# Patient Record
Sex: Male | Born: 1995 | Race: White | Hispanic: No | Marital: Single | Smoking: Current every day smoker
Health system: Southern US, Community
[De-identification: ages and names within clinical notes are randomized; demographics above are authoritative.]

## PROBLEM LIST (undated history)

## (undated) DIAGNOSIS — K589 Irritable bowel syndrome without diarrhea: Secondary | ICD-10-CM

## (undated) DIAGNOSIS — K219 Gastro-esophageal reflux disease without esophagitis: Secondary | ICD-10-CM

## (undated) DIAGNOSIS — F5089 Other specified eating disorder: Secondary | ICD-10-CM

## (undated) DIAGNOSIS — F5083 Pica in adults: Secondary | ICD-10-CM

## (undated) DIAGNOSIS — R109 Unspecified abdominal pain: Secondary | ICD-10-CM

## (undated) HISTORY — DX: Unspecified abdominal pain: R10.9

---
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
## 2015-07-16 ENCOUNTER — Encounter: Payer: Self-pay | Admitting: Emergency Medicine

## 2015-07-16 ENCOUNTER — Emergency Department
Admission: EM | Admit: 2015-07-16 | Discharge: 2015-07-16 | Disposition: A | Discharge status: Home / Self Care | Payer: Medicaid Other | Attending: Emergency Medicine | Admitting: Emergency Medicine

## 2015-07-16 ENCOUNTER — Emergency Department: Payer: Medicaid Other

## 2015-07-16 DIAGNOSIS — R109 Unspecified abdominal pain: Secondary | ICD-10-CM | POA: Insufficient documentation

## 2015-07-16 DIAGNOSIS — R112 Nausea with vomiting, unspecified: Secondary | ICD-10-CM

## 2015-07-16 DIAGNOSIS — Z720 Tobacco use: Secondary | ICD-10-CM | POA: Insufficient documentation

## 2015-07-16 LAB — URINALYSIS COMPLETE WITH MICROSCOPIC (ARMC ONLY)
BILIRUBIN URINE: NEGATIVE
Bacteria, UA: NONE SEEN
Glucose, UA: NEGATIVE mg/dL
Hgb urine dipstick: NEGATIVE
KETONES UR: NEGATIVE mg/dL
LEUKOCYTES UA: NEGATIVE
Nitrite: NEGATIVE
PH: 7 (ref 5.0–8.0)
Protein, ur: NEGATIVE mg/dL
RBC / HPF: NONE SEEN RBC/hpf (ref 0–5)
SQUAMOUS EPITHELIAL / LPF: NONE SEEN
Specific Gravity, Urine: 1.017 (ref 1.005–1.030)
WBC UA: NONE SEEN WBC/hpf (ref 0–5)

## 2015-07-16 LAB — CBC
HEMATOCRIT: 45.8 % (ref 40.0–52.0)
HEMOGLOBIN: 15.5 g/dL (ref 13.0–18.0)
MCH: 29.7 pg (ref 26.0–34.0)
MCHC: 33.9 g/dL (ref 32.0–36.0)
MCV: 87.9 fL (ref 80.0–100.0)
PLATELETS: 189 10*3/uL (ref 150–440)
RBC: 5.21 MIL/uL (ref 4.40–5.90)
RDW: 14 % (ref 11.5–14.5)
WBC: 8.9 10*3/uL (ref 3.8–10.6)

## 2015-07-16 LAB — COMPREHENSIVE METABOLIC PANEL
ALK PHOS: 63 U/L (ref 38–126)
ALT: 17 U/L (ref 17–63)
AST: 28 U/L (ref 15–41)
Albumin: 4.7 g/dL (ref 3.5–5.0)
Anion gap: 8 (ref 5–15)
BUN: 9 mg/dL (ref 6–20)
CALCIUM: 9.6 mg/dL (ref 8.9–10.3)
CHLORIDE: 104 mmol/L (ref 101–111)
CO2: 29 mmol/L (ref 22–32)
CREATININE: 0.86 mg/dL (ref 0.61–1.24)
Glucose, Bld: 104 mg/dL — ABNORMAL HIGH (ref 65–99)
Potassium: 4.1 mmol/L (ref 3.5–5.1)
SODIUM: 141 mmol/L (ref 135–145)
TOTAL PROTEIN: 7.4 g/dL (ref 6.5–8.1)
Total Bilirubin: 1.3 mg/dL — ABNORMAL HIGH (ref 0.3–1.2)

## 2015-07-16 LAB — LIPASE, BLOOD: Lipase: 16 U/L — ABNORMAL LOW (ref 22–51)

## 2015-07-16 MED ORDER — RANITIDINE HCL 150 MG PO TABS: 150.0000 mg | ORAL_TABLET | Freq: Two times a day (BID) | ORAL | Status: DC

## 2015-07-16 NOTE — ED Provider Notes (Signed)
[facility] Emergency Department Provider Note  ____________________________________________  Time seen: Approximately 410 PM  I have reviewed the triage vital signs and the nursing notes.   HISTORY  Chief Complaint Emesis    HPI Matthew Christian is a 19 y.o. male with a history of IBS and Hycotuss who presents today with vomiting over the past 24 hours. He denies any blood in his vomit or green vomit. He denies any diarrhea. Says that he has been eating the plastic sticks from the middle of Q-tips. He says that he is about 1 a day. Says that he has enough hole remote control in the past. However, this was one year ago. Says that he has been eating nothing by Q-tips recently. He denies any abdominal pain. Said that he was diagnosed also with reflux and used to be on Protonix. However, is not on this anymore. Says that he has had similar episodes of nausea vomiting in the past when he has been off and an antacid. Denies any discharge from his penis. Not concerned for any sexually transmitted diseases. No history of sexually transmitted diseases.Has been to gastroenterologist in the past for this was a pediatric gastroenterologist. Says his mother had a history of Crohn's disease. Denies any pain in his penis or testicles. Denies any blood in his stool. Past Medical History  Diagnosis Date  . Abdominal pain, recurrent     Patient Active Problem List   Diagnosis Date Noted  . Diarrhea 04/08/2012  . Halitosis 04/08/2012  . Nausea 04/08/2012  . Pica in adults 04/08/2012  . Epigastric abdominal pain     History reviewed. No pertinent past surgical history.  Current Outpatient Rx  Name  Route  Sig  Dispense  Refill  . EXPIRED: Inulin 2 G CHEW   Oral   Chew 1 tablet (2 g total) by mouth daily.   100 tablet   0   . EXPIRED: pantoprazole (PROTONIX) 40 MG tablet   Oral   Take 1 tablet (40 mg total) by mouth daily.   30 tablet   6     Allergies Review of  patient's allergies indicates no known allergies.  Family History  Problem Relation Age of Onset  . Adopted: Yes  . Irritable bowel syndrome Mother     Social History History  Substance Use Topics  . Smoking status: Current Every Day Smoker    Types: Cigarettes  . Smokeless tobacco: Current User    Types: Snuff, Chew  . Alcohol Use: No    Review of Systems Constitutional: No fever/chills Eyes: No visual changes. ENT: No sore throat. Cardiovascular: Denies chest pain. Respiratory: Denies shortness of breath. Gastrointestinal: No abdominal pain.    No diarrhea.  No constipation. Genitourinary: Negative for dysuria. Musculoskeletal: Negative for back pain. Skin: Negative for rash. Neurological: Negative for headaches, focal weakness or numbness.  10-point ROS otherwise negative.  ____________________________________________   PHYSICAL EXAM:  VITAL SIGNS: ED Triage Vitals  Enc Vitals Group     BP 07/16/15 1245 128/80 mmHg     Pulse Rate 07/16/15 1245 63     Resp 07/16/15 1245 18     Temp 07/16/15 1245 98.1 F (36.7 C)     Temp Source 07/16/15 1245 Oral     SpO2 07/16/15 1245 100 %     Weight 07/16/15 1245 144 lb (65.318 kg)     Height 07/16/15 1245  (1.778 m)     Head Cir --  Peak Flow --      Pain Score 07/16/15 1250 5     Pain Loc --      Pain Edu? --      Excl. in GC? --     Constitutional: Alert and oriented. Well appearing and in no acute distress. Eyes: Conjunctivae are normal. PERRL. EOMI. Head: Atraumatic. Nose: No congestion/rhinnorhea. Mouth/Throat: Mucous membranes are moist.  Oropharynx non-erythematous. Neck: No stridor.   Cardiovascular: Normal rate, regular rhythm. Grossly normal heart sounds.  Good peripheral circulation. Respiratory: Normal respiratory effort.  No retractions. Lungs CTAB. Gastrointestinal: Soft with mild to moderate tenderness across the lower abdomen. No rebound or guarding. No distention. No abdominal bruits.  No CVA tenderness. Musculoskeletal: No lower extremity tenderness nor edema.  No joint effusions. Neurologic:  Normal speech and language. No gross focal neurologic deficits are appreciated. No gait instability. Skin:  Skin is warm, dry and intact. No rash noted. Psychiatric: Mood and affect are normal. Speech and behavior are normal.  ____________________________________________   LABS (all labs ordered are listed, but only abnormal results are displayed)  Labs Reviewed  LIPASE, BLOOD - Abnormal; Notable for the following:    Lipase 16 (*)    All other components within normal limits  COMPREHENSIVE METABOLIC PANEL - Abnormal; Notable for the following:    Glucose, Bld 104 (*)    Total Bilirubin 1.3 (*)    All other components within normal limits  URINALYSIS COMPLETEWITH MICROSCOPIC ([facility] ONLY) - Abnormal; Notable for the following:    Color, Urine YELLOW (*)    APPearance CLOUDY (*)    All other components within normal limits  CBC   ____________________________________________  EKG   ____________________________________________  RADIOLOGY  No radiographic evidence of acute cardiopulmonary disease. Nonobstructive bowel gas pattern. No pneumoperitoneum. ____________________________________________   PROCEDURES    ____________________________________________   INITIAL IMPRESSION / ASSESSMENT AND PLAN / ED COURSE  Pertinent labs & imaging results that were available during my care of the patient were reviewed by me and considered in my medical decision making (see chart for details).  ----------------------------------------- 5:07 PM on 07/16/2015 -----------------------------------------  Patient resting comfortable at this time. Discussed the results of his labs as well as imaging with him and his mother. We'll discharge with Zantac. We'll give follow-up information for gastroenterology. Possible GERD, gastritis. Cannot rule out Crohn's disease although there  is no diarrhea and the patient is a bit old to have a first flare. ____________________________________________   FINAL CLINICAL IMPRESSION(S) / ED DIAGNOSES  Acute abdominal pain. Acute nausea and vomiting.    Myrna Blazer, MD 07/16/15 [phone]

## 2015-07-16 NOTE — Discharge Instructions (Signed)

## 2015-07-16 NOTE — ED Notes (Signed)
Pt to ed with c/o intermittent vomiting since Friday.  Pt states vomited x 2 in the last 24 hours.  Pt alert and oriented, skin warm and dry. Appears in no distress.  Pt also c/o right foot toe numbness intermittently x 3 weeks.

## 2015-07-16 NOTE — Progress Notes (Addendum)
   07/16/15 1600  Clinical Encounter Type  Visited With Patient  Visit Type Spiritual support  Spiritual Encounters  Spiritual Needs Emotional  Stress Factors  Patient Stress Factors Health changes   Faith: Universalist Status: Epigastric abdominal pain /alert and oriented sitting in room settee Age/Sex: 19 yrs old male Family: none present but he says he has a fiance with friendship ring and he says his parents are very supportive. He says he has no children. Visit Assessment: He shared that his girlfriend was coming to the hospital after she purchases a dog. 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 He says he's waiting on doctor. He shared that he is glad that he does not have children. He seems to be in good spirits overall.  Pastoral care can be reached via pager at [phone] and by submitting an online request.

## 2015-07-16 NOTE — ED Notes (Signed)
Some vomiting past few days

## 2016-07-03 ENCOUNTER — Inpatient Hospital Stay
Admission: EM | Admit: 2016-07-03 | Discharge: 2016-07-05 | DRG: 683 | Disposition: A | Discharge status: Home / Self Care | Payer: Self-pay | Attending: Internal Medicine | Admitting: Internal Medicine

## 2016-07-03 ENCOUNTER — Emergency Department: Payer: Self-pay

## 2016-07-03 ENCOUNTER — Encounter: Payer: Self-pay | Admitting: Emergency Medicine

## 2016-07-03 DIAGNOSIS — R112 Nausea with vomiting, unspecified: Secondary | ICD-10-CM

## 2016-07-03 DIAGNOSIS — K219 Gastro-esophageal reflux disease without esophagitis: Secondary | ICD-10-CM | POA: Diagnosis present

## 2016-07-03 DIAGNOSIS — N179 Acute kidney failure, unspecified: Principal | ICD-10-CM | POA: Diagnosis present

## 2016-07-03 DIAGNOSIS — Z79899 Other long term (current) drug therapy: Secondary | ICD-10-CM

## 2016-07-03 DIAGNOSIS — R7989 Other specified abnormal findings of blood chemistry: Secondary | ICD-10-CM | POA: Diagnosis present

## 2016-07-03 DIAGNOSIS — E860 Dehydration: Secondary | ICD-10-CM | POA: Diagnosis present

## 2016-07-03 DIAGNOSIS — K2900 Acute gastritis without bleeding: Secondary | ICD-10-CM | POA: Diagnosis present

## 2016-07-03 DIAGNOSIS — T670XXA Heatstroke and sunstroke, initial encounter: Secondary | ICD-10-CM | POA: Diagnosis present

## 2016-07-03 DIAGNOSIS — R109 Unspecified abdominal pain: Secondary | ICD-10-CM

## 2016-07-03 DIAGNOSIS — Z716 Tobacco abuse counseling: Secondary | ICD-10-CM

## 2016-07-03 DIAGNOSIS — F17210 Nicotine dependence, cigarettes, uncomplicated: Secondary | ICD-10-CM | POA: Diagnosis present

## 2016-07-03 DIAGNOSIS — M6282 Rhabdomyolysis: Secondary | ICD-10-CM | POA: Diagnosis present

## 2016-07-03 DIAGNOSIS — A419 Sepsis, unspecified organism: Secondary | ICD-10-CM

## 2016-07-03 DIAGNOSIS — K589 Irritable bowel syndrome without diarrhea: Secondary | ICD-10-CM | POA: Diagnosis present

## 2016-07-03 HISTORY — DX: Irritable bowel syndrome, unspecified: K58.9

## 2016-07-03 HISTORY — DX: Other specified eating disorder: F50.89

## 2016-07-03 HISTORY — DX: Pica in adults: F50.83

## 2016-07-03 LAB — BASIC METABOLIC PANEL
ANION GAP: 11 (ref 5–15)
Anion gap: 26 — ABNORMAL HIGH (ref 5–15)
BUN: 53 mg/dL — AB (ref 6–20)
BUN: 41 mg/dL — ABNORMAL HIGH (ref 6–20)
CHLORIDE: 93 mmol/L — AB (ref 101–111)
CHLORIDE: 104 mmol/L (ref 101–111)
CO2: 19 mmol/L — AB (ref 22–32)
CO2: 25 mmol/L (ref 22–32)
CREATININE: 4.79 mg/dL — AB (ref 0.61–1.24)
Calcium: 11.4 mg/dL — ABNORMAL HIGH (ref 8.9–10.3)
Calcium: 9.2 mg/dL (ref 8.9–10.3)
Creatinine, Ser: 2.07 mg/dL — ABNORMAL HIGH (ref 0.61–1.24)
GFR calc Af Amer: 19 mL/min — ABNORMAL LOW (ref >60)
GFR calc Af Amer: 52 mL/min — ABNORMAL LOW (ref >60)
GFR calc non Af Amer: 16 mL/min — ABNORMAL LOW (ref >60)
GFR calc non Af Amer: 44 mL/min — ABNORMAL LOW (ref >60)
GLUCOSE: 183 mg/dL — AB (ref 65–99)
GLUCOSE: 115 mg/dL — AB (ref 65–99)
POTASSIUM: 4 mmol/L (ref 3.5–5.1)
POTASSIUM: 3.7 mmol/L (ref 3.5–5.1)
Sodium: 138 mmol/L (ref 135–145)
Sodium: 140 mmol/L (ref 135–145)

## 2016-07-03 LAB — CBC WITH DIFFERENTIAL/PLATELET
BASOS PCT: 0 %
Basophils Absolute: 0 10*3/uL (ref 0–0.1)
EOS ABS: 0 10*3/uL (ref 0–0.7)
Eosinophils Relative: 0 %
HEMATOCRIT: 52.7 % — AB (ref 40.0–52.0)
HEMOGLOBIN: 18.4 g/dL — AB (ref 13.0–18.0)
LYMPHS PCT: 8 %
Lymphs Abs: 2.2 10*3/uL (ref 1.0–3.6)
MCH: 29.6 pg (ref 26.0–34.0)
MCHC: 34.9 g/dL (ref 32.0–36.0)
MCV: 84.8 fL (ref 80.0–100.0)
MONOS PCT: 8 %
Monocytes Absolute: 2.2 10*3/uL — ABNORMAL HIGH (ref 0.2–1.0)
NEUTROS ABS: 23.5 10*3/uL — AB (ref 1.4–6.5)
NEUTROS PCT: 84 %
Platelets: 229 10*3/uL (ref 150–440)
RBC: 6.21 MIL/uL — ABNORMAL HIGH (ref 4.40–5.90)
RDW: 14.3 % (ref 11.5–14.5)
WBC: 27.9 10*3/uL — ABNORMAL HIGH (ref 3.8–10.6)

## 2016-07-03 LAB — GLUCOSE, CAPILLARY
GLUCOSE-CAPILLARY: 184 mg/dL — AB (ref 65–99)
Glucose-Capillary: 103 mg/dL — ABNORMAL HIGH (ref 65–99)

## 2016-07-03 LAB — URINALYSIS COMPLETE WITH MICROSCOPIC (ARMC ONLY)
Bacteria, UA: NONE SEEN
Bilirubin Urine: NEGATIVE
Glucose, UA: NEGATIVE mg/dL
Ketones, ur: NEGATIVE mg/dL
Leukocytes, UA: NEGATIVE
Nitrite: NEGATIVE
Protein, ur: 30 mg/dL — AB
Specific Gravity, Urine: 1.016 (ref 1.005–1.030)
Squamous Epithelial / HPF: NONE SEEN
pH: 5 (ref 5.0–8.0)

## 2016-07-03 LAB — CBC
HEMATOCRIT: 58.6 % — AB (ref 40.0–52.0)
Hemoglobin: 20.1 g/dL — ABNORMAL HIGH (ref 13.0–18.0)
MCH: 29 pg (ref 26.0–34.0)
MCHC: 34.2 g/dL (ref 32.0–36.0)
MCV: 84.9 fL (ref 80.0–100.0)
Platelets: 288 10*3/uL (ref 150–440)
RBC: 6.9 MIL/uL — ABNORMAL HIGH (ref 4.40–5.90)
RDW: 14 % (ref 11.5–14.5)
WBC: 32.5 10*3/uL — AB (ref 3.8–10.6)

## 2016-07-03 LAB — URINE DRUG SCREEN, QUALITATIVE (ARMC ONLY)
AMPHETAMINES, UR SCREEN: NOT DETECTED
Barbiturates, Ur Screen: NOT DETECTED
Benzodiazepine, Ur Scrn: NOT DETECTED
Cannabinoid 50 Ng, Ur ~~LOC~~: POSITIVE — AB
Cocaine Metabolite,Ur ~~LOC~~: NOT DETECTED
MDMA (ECSTASY) UR SCREEN: NOT DETECTED
Methadone Scn, Ur: NOT DETECTED
OPIATE, UR SCREEN: NOT DETECTED
PHENCYCLIDINE (PCP) UR S: NOT DETECTED
Tricyclic, Ur Screen: NOT DETECTED

## 2016-07-03 LAB — HEPATIC FUNCTION PANEL
ALBUMIN: 6.9 g/dL — AB (ref 3.5–5.0)
ALK PHOS: 102 U/L (ref 38–126)
ALT: 38 U/L (ref 17–63)
AST: 53 U/L — AB (ref 15–41)
BILIRUBIN INDIRECT: 1.8 mg/dL — AB (ref 0.3–0.9)
Bilirubin, Direct: 0.2 mg/dL (ref 0.1–0.5)
TOTAL PROTEIN: 10.5 g/dL — AB (ref 6.5–8.1)
Total Bilirubin: 2 mg/dL — ABNORMAL HIGH (ref 0.3–1.2)

## 2016-07-03 LAB — LACTIC ACID, PLASMA
LACTIC ACID, VENOUS: 1.7 mmol/L (ref 0.5–1.9)
Lactic Acid, Venous: 2.5 mmol/L (ref 0.5–1.9)

## 2016-07-03 LAB — MRSA PCR SCREENING: MRSA by PCR: NEGATIVE

## 2016-07-03 LAB — CK
Total CK: 848 U/L — ABNORMAL HIGH (ref 49–397)
Total CK: 1070 U/L — ABNORMAL HIGH (ref 49–397)

## 2016-07-03 LAB — LIPASE, BLOOD: Lipase: 20 U/L (ref 11–51)

## 2016-07-03 MED ORDER — SODIUM BICARBONATE 8.4 % IV SOLN
Freq: Once | INTRAVENOUS | Status: AC
  Administered 2016-07-03: 18:00:00 via INTRAVENOUS
  Filled 2016-07-03: qty 850

## 2016-07-03 MED ORDER — ONDANSETRON HCL 4 MG/2ML IJ SOLN: 4.0000 mg | Freq: Four times a day (QID) | INTRAMUSCULAR | Status: DC | PRN

## 2016-07-03 MED ORDER — DIATRIZOATE MEGLUMINE & SODIUM 66-10 % PO SOLN
30.0000 mL | Freq: Once | ORAL | Status: AC
  Administered 2016-07-03: 30 mL via ORAL

## 2016-07-03 MED ORDER — SODIUM CHLORIDE 0.9 % IV SOLN
INTRAVENOUS | Status: DC
  Administered 2016-07-04: 22:00:00 via INTRAVENOUS
  Administered 2016-07-04: 100 mL/h via INTRAVENOUS
  Administered 2016-07-04: 13:00:00 via INTRAVENOUS

## 2016-07-03 MED ORDER — ONDANSETRON HCL 4 MG/2ML IJ SOLN
4.0000 mg | Freq: Once | INTRAMUSCULAR | Status: AC
  Administered 2016-07-03: 4 mg via INTRAVENOUS
  Filled 2016-07-03: qty 2

## 2016-07-03 MED ORDER — PANTOPRAZOLE SODIUM 40 MG IV SOLR
40.0000 mg | Freq: Two times a day (BID) | INTRAVENOUS | Status: DC
  Administered 2016-07-03 – 2016-07-04 (×2): 40 mg via INTRAVENOUS
  Filled 2016-07-03 (×2): qty 40

## 2016-07-03 MED ORDER — ACETAMINOPHEN 650 MG RE SUPP: 650.0000 mg | Freq: Four times a day (QID) | RECTAL | Status: DC | PRN

## 2016-07-03 MED ORDER — ONDANSETRON HCL 4 MG/2ML IJ SOLN
4.0000 mg | Freq: Once | INTRAMUSCULAR | Status: AC
  Administered 2016-07-03: 4 mg via INTRAVENOUS

## 2016-07-03 MED ORDER — PROMETHAZINE HCL 25 MG/ML IJ SOLN
12.5000 mg | INTRAMUSCULAR | Status: AC
  Administered 2016-07-03: 12.5 mg via INTRAVENOUS
  Filled 2016-07-03: qty 1

## 2016-07-03 MED ORDER — ACETAMINOPHEN 325 MG PO TABS: 650.0000 mg | ORAL_TABLET | Freq: Four times a day (QID) | ORAL | Status: DC | PRN

## 2016-07-03 MED ORDER — SODIUM CHLORIDE 0.9 % IV BOLUS (SEPSIS)
1000.0000 mL | Freq: Once | INTRAVENOUS | Status: AC
  Administered 2016-07-03: 1000 mL via INTRAVENOUS

## 2016-07-03 MED ORDER — ONDANSETRON HCL 4 MG/2ML IJ SOLN
INTRAMUSCULAR | Status: AC
  Administered 2016-07-03: 4 mg via INTRAVENOUS
  Filled 2016-07-03: qty 2

## 2016-07-03 MED ORDER — MORPHINE SULFATE (PF) 2 MG/ML IV SOLN: 1.0000 mg | INTRAVENOUS | Status: DC | PRN

## 2016-07-03 MED ORDER — ONDANSETRON HCL 4 MG PO TABS
4.0000 mg | ORAL_TABLET | Freq: Four times a day (QID) | ORAL | Status: DC | PRN
  Administered 2016-07-04: 4 mg via ORAL
  Filled 2016-07-03: qty 1

## 2016-07-03 MED ORDER — PIPERACILLIN-TAZOBACTAM 3.375 G IVPB 30 MIN
3.3750 g | Freq: Once | INTRAVENOUS | Status: AC
  Administered 2016-07-03: 3.375 g via INTRAVENOUS
  Filled 2016-07-03: qty 50

## 2016-07-03 MED ORDER — MORPHINE SULFATE (PF) 4 MG/ML IV SOLN
INTRAVENOUS | Status: AC
  Filled 2016-07-03: qty 1

## 2016-07-03 MED ORDER — STERILE WATER FOR INJECTION IV SOLN
Freq: Once | INTRAVENOUS | Status: DC
  Filled 2016-07-03: qty 850

## 2016-07-03 MED ORDER — VANCOMYCIN HCL IN DEXTROSE 1-5 GM/200ML-% IV SOLN
1000.0000 mg | Freq: Once | INTRAVENOUS | Status: AC
  Administered 2016-07-03: 1000 mg via INTRAVENOUS
  Filled 2016-07-03: qty 200

## 2016-07-03 NOTE — ED Provider Notes (Signed)
[facility] Emergency Department Provider Note   ____________________________________________  Time seen: Approximately 1:48 PM  I have reviewed the triage vital signs and the nursing notes.   HISTORY  Chief Complaint Emesis and Near Syncope    HPI Matthew Christian is a 20 y.o. male with history of IBS who presents for evaluation of recurrent nonbilious emesis since last night, episodes too numerous to count, severe, no modifying factors. Patient reports that he has seen some streaks of blood in his vomit today. No frank hematemesis. No diarrhea. Last bowel movement yesterday. No fevers or chills. He has felt lightheaded, no chest pain or difficulty breathing. He does have abdominal cramping. He works outside as a Investment banker, corporate, has been in the heat.   Past Medical History  Diagnosis Date  . Abdominal pain, recurrent   . IBS (irritable bowel syndrome)   . Pica in adults     Patient Active Problem List   Diagnosis Date Noted  . Diarrhea 04/08/2012  . Halitosis 04/08/2012  . Nausea 04/08/2012  . Pica in adults 04/08/2012  . Epigastric abdominal pain     History reviewed. No pertinent past surgical history.  Current Outpatient Rx  Name  Route  Sig  Dispense  Refill  . EXPIRED: pantoprazole (PROTONIX) 40 MG tablet   Oral   Take 1 tablet (40 mg total) by mouth daily.   30 tablet   6     Allergies Review of patient's allergies indicates no known allergies.  Family History  Problem Relation Age of Onset  . Adopted: Yes  . Irritable bowel syndrome Mother     Social History Social History  Substance Use Topics  . Smoking status: Current Every Day Smoker    Types: Cigarettes  . Smokeless tobacco: Current User    Types: Snuff, Chew  . Alcohol Use: No    Review of Systems Constitutional: No fever/chills Eyes: No visual changes. ENT: No sore throat. Cardiovascular: Denies chest pain. Respiratory: Denies shortness of  breath. Gastrointestinal: + abdominal pain.  + nausea, + vomiting.  No diarrhea.  No constipation. Genitourinary: Negative for dysuria. Musculoskeletal: Negative for back pain. Skin: Negative for rash. Neurological: Negative for headaches, focal weakness or numbness.  10-point ROS otherwise negative.  ____________________________________________   PHYSICAL EXAM:  VITAL SIGNS: ED Triage Vitals  Enc Vitals Group     BP 07/03/16 1242 119/89 mmHg     Pulse Rate 07/03/16 1242 129     Resp --      Temp 07/03/16 1242 97.5 F (36.4 C)     Temp Source 07/03/16 1242 Oral     SpO2 07/03/16 1242 99 %     Weight 07/03/16 1242 135 lb (61.236 kg)     Height 07/03/16 1242  (1.778 m)     Head Cir --      Peak Flow --      Pain Score 07/03/16 1246 9     Pain Loc --      Pain Edu? --      Excl. in GC? --     Constitutional: Alert and oriented. Nontoxic- appearing and in no acute distress. Eyes: Conjunctivae are normal. PERRL. EOMI. Head: Atraumatic. Nose: No congestion/rhinnorhea. Mouth/Throat: Mucous membranes are dry.  Oropharynx non-erythematous. Neck: No stridor.  Supple without meningismus. Cardiovascular: Tachycardic rate, regular rhythm. Grossly normal heart sounds.  Good peripheral circulation. Respiratory: Normal respiratory effort.  No retractions. Lungs CTAB. Gastrointestinal: Soft with mild tenderness in the right mid abdomen  and the right lower quadrant. No CVA tenderness. Genitourinary: Deferred. Musculoskeletal: No lower extremity tenderness nor edema.  No joint effusions. Neurologic:  Normal speech and language. No gross focal neurologic deficits are appreciated. No gait instability. Skin:  Skin is warm, dry and intact. No rash noted. Psychiatric: Mood and affect are normal. Speech and behavior are normal.  ____________________________________________   LABS (all labs ordered are listed, but only abnormal results are displayed)  Labs Reviewed  BASIC METABOLIC  PANEL - Abnormal; Notable for the following:    Chloride 93 (*)    CO2 19 (*)    Glucose, Bld 183 (*)    BUN 53 (*)    Creatinine, Ser 4.79 (*)    Calcium 11.4 (*)    GFR calc non Af Amer 16 (*)    GFR calc Af Amer 19 (*)    Anion gap 26 (*)    All other components within normal limits  CBC - Abnormal; Notable for the following:    WBC 32.5 (*)    RBC 6.90 (*)    Hemoglobin 20.1 (*)    HCT 58.6 (*)    All other components within normal limits  GLUCOSE, CAPILLARY - Abnormal; Notable for the following:    Glucose-Capillary 184 (*)    All other components within normal limits  HEPATIC FUNCTION PANEL - Abnormal; Notable for the following:    Total Protein 10.5 (*)    Albumin 6.9 (*)    AST 53 (*)    Total Bilirubin 2.0 (*)    Indirect Bilirubin 1.8 (*)    All other components within normal limits  CBC WITH DIFFERENTIAL/PLATELET - Abnormal; Notable for the following:    WBC 27.9 (*)    RBC 6.21 (*)    Hemoglobin 18.4 (*)    HCT 52.7 (*)    Neutro Abs 23.5 (*)    Monocytes Absolute 2.2 (*)    All other components within normal limits  LACTIC ACID, PLASMA - Abnormal; Notable for the following:    Lactic Acid, Venous 2.5 (*)    All other components within normal limits  CK - Abnormal; Notable for the following:    Total CK 848 (*)    All other components within normal limits  CULTURE, BLOOD (ROUTINE X 2)  CULTURE, BLOOD (ROUTINE X 2)  LIPASE, BLOOD  URINALYSIS COMPLETEWITH MICROSCOPIC ([facility] ONLY)  LACTIC ACID, PLASMA  CBG MONITORING, ED   ____________________________________________  EKG  ED ECG REPORT I, Gayla DossGayle, Tong Pieczynski A, the attending physician, personally viewed and interpreted this ECG.   Date: 07/03/2016  EKG Time: 12:54  Rate: 133  Rhythm: sinus tachycardia  Axis: right  Intervals:none  ST&T Change: No acute ST elevation or acute ST depression.  ____________________________________________  RADIOLOGY  CT abdomen and pelvis IMPRESSION: No acute  abnormalities. ____________________________________________   PROCEDURES  Procedure(s) performed: None  Procedures  Critical Care performed: Yes, see critical care note(s) .  CRITICAL CARE Performed by: Toney RakesGayle, Sayan Aldava A   Total critical care time: 35 minutes  Critical care time was exclusive of separately billable procedures and treating other patients.  Critical care was necessary to treat or prevent imminent or life-threatening deterioration.  Critical care was time spent personally by me on the following activities: development of treatment plan with patient and/or surrogate as well as nursing, discussions with consultants, evaluation of patient's response to treatment, examination of patient, obtaining history from patient or surrogate, ordering and performing treatments and interventions, ordering and review of laboratory  studies, ordering and review of radiographic studies, pulse oximetry and re-evaluation of patient's condition.  ____________________________________________   INITIAL IMPRESSION / ASSESSMENT AND PLAN / ED COURSE  Pertinent labs & imaging results that were available during my care of the patient were reviewed by me and considered in my medical decision making (see chart for details).  Autrey Human is a 20 y.o. male with history of IBS who presents for evaluation of recurrent nonbilious emesis since last night, episodes too numerous to count, severe, no modifying factors. On exam, he is nontoxic appearing and in no acute distress. Initially tachycardic with heart rate in the 130s which is now improved to 85 after 1 L of normal saline. The remainder of his vital signs are stable and he is afebrile. He does have mild tender to palpation throughout the right abdomen. I reviewed his labs, CBC with a white blood cell count of 32,000, hemoglobin of 20.1 and he does appear hemoconcentrated, continue aggressive IV fluid resuscitation. I will send a repeat CBC with  differential. BMP shows elevated BUN and creatinine, creatinine is 4.79, hypercalcemia concerning for acute renal failure likely prerenal azotemia. We'll obtain CT of the abdomen and pelvis with by mouth contrast only to evaluate for acute appendicitis and anticipate admission. Blood cultures and venous lactic acid also sent and code sepsis initiated. Will give IV vancomycin and zosyn.  ----------------------------------------- 4:58 PM on 07/03/2016 ----------------------------------------- CT scan shows no acute intra-abdominal pelvic pathology. I discussed the case with Dr. Cherie Dark of nephrology. I discussed the case with the hospitalist, Dr. Hilton Sinclair, for admission at this time. CK was also elevated at 848, concerning for rhabdomyolysis.  ____________________________________________   FINAL CLINICAL IMPRESSION(S) / ED DIAGNOSES  Final diagnoses:  Non-intractable vomiting with nausea, vomiting of unspecified type  Abdominal pain, unspecified abdominal location  Acute renal failure, unspecified acute renal failure type (HCC)  Non-traumatic rhabdomyolysis  Sepsis, due to unspecified organism [facility])      NEW MEDICATIONS STARTED DURING THIS VISIT:  New Prescriptions   No medications on file     Note:  This document was prepared using Dragon voice recognition software and may include unintentional dictation errors.    Gayla Doss, MD 07/03/16 1700

## 2016-07-03 NOTE — ED Notes (Signed)
MD at bedside. 

## 2016-07-03 NOTE — ED Notes (Addendum)
Pt states that he is unable to urinate.   

## 2016-07-03 NOTE — ED Notes (Signed)
Pt presents to ED with reports of nausea and vomiting and possible heat related illness since yesterday. 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 Pt states he has been trying to drink gatorade and water.

## 2016-07-03 NOTE — H&P (Addendum)
[facility] - [place] at [facility]   PATIENT NAME: Matthew Christian    MR#:  [id number]  DATE OF BIRTH:  [birth year]  DATE OF ADMISSION:  07/03/2016  PRIMARY CARE PHYSICIAN: Judee Clara, RN   REQUESTING/REFERRING PHYSICIAN: Dr Inocencio Homes  CHIEF COMPLAINT:   Intractable nausea vomiting and cramping of abdomen and all over the body for  1 day HISTORY OF PRESENT ILLNESS:  Matthew Christian  is a 20 y.o. male with a known history of IBS and severe acid reflux comes to the emergency room accompanied by mother and girlfriend with intractable nausea and vomiting and cramping all over the body including abdominal pain and coffee-ground emesis since 1 day. 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 He's been trying to keep up with drinking Gatorade to prevent dehydration how work came in with intractable nausea vomiting and was found to be severely dehydrated with elevated creatinine of 4.79. Patient's baseline creatinine is 0.88 in 2016. He was also found to have elevated white count of 32,000. No source of infection has been identified. Patient is being admitted for severe acute renal failure secondary to heat stroke. He received empiric antibiotic IV Vanco and Zosyn in the emergency room. He has history of severe acid reflux and has been having coffee ground emesis intermittently.  PAST MEDICAL HISTORY:   Past Medical History  Diagnosis Date  . Abdominal pain, recurrent   . IBS (irritable bowel syndrome)   . Pica in adults     PAST SURGICAL HISTOIRY:  History reviewed. No pertinent past surgical history.  SOCIAL HISTORY:   Social History  Substance Use Topics  . Smoking status: Current Every Day Smoker    Types: Cigarettes  . Smokeless tobacco: Current User    Types: Snuff, Chew  . Alcohol Use: No    FAMILY HISTORY:   Family History  Problem Relation Age of Onset  . Adopted: Yes  . Irritable bowel syndrome  Mother     DRUG ALLERGIES:  No Known Allergies  REVIEW OF SYSTEMS:  Review of Systems  Constitutional: Positive for malaise/fatigue. Negative for fever, chills and weight loss.  HENT: Negative for ear discharge, ear pain and nosebleeds.   Eyes: Negative for blurred vision, pain and discharge.  Respiratory: Negative for sputum production, shortness of breath, wheezing and stridor.   Cardiovascular: Negative for chest pain, palpitations, orthopnea and PND.  Gastrointestinal: Positive for nausea, vomiting and abdominal pain. Negative for diarrhea.  Genitourinary: Negative for urgency and frequency.  Musculoskeletal: Negative for back pain and joint pain.  Neurological: Positive for weakness. Negative for sensory change, speech change and focal weakness.  Psychiatric/Behavioral: Negative for depression and hallucinations. The patient is not nervous/anxious.   All other systems reviewed and are negative.    MEDICATIONS AT HOME:   Prior to Admission medications   Medication Sig Start Date End Date Taking? Authorizing Provider  pantoprazole (PROTONIX) 40 MG tablet Take 1 tablet (40 mg total) by mouth daily. 06/14/12 06/14/13  Jon Gills, MD      VITAL SIGNS:  Blood pressure 140/77, pulse 89, temperature 97.5 F (36.4 C), temperature source Oral, resp. rate 13, height 5\' 10"  (1.778 m), weight 61.236 kg (135 lb), SpO2 100 %.  PHYSICAL EXAMINATION:  GENERAL:  20 y.o.-year-old patient lying in the bed with no acute distress. Critically ill EYES: Pupils equal, round, reactive to light and accommodation. No scleral icterus. Extraocular muscles intact.  HEENT: Head atraumatic, normocephalic. Oropharynx and nasopharynx clear. Oral mucosa is severely dry facial skin flushing NECK:  Supple, no jugular venous distention. No thyroid enlargement, no tenderness.  LUNGS: Normal breath sounds bilaterally, no wheezing, rales,rhonchi or crepitation. No use of accessory muscles of respiration.   CARDIOVASCULAR: S1, S2 normal. No murmurs, rubs, or gallops. Tachycardia ABDOMEN: Soft, nontender, nondistended. Bowel sounds present. No organomegaly or mass.  EXTREMITIES: No pedal edema, cyanosis, or clubbing.  NEUROLOGIC: Cranial nerves II through XII are intact. Muscle strength 5/5 in all extremities. Sensation intact. Gait not checked.  PSYCHIATRIC: The patient is alert and oriented x 3.  SKIN: No obvious rash, lesion, or ulcer. Skin appears tanned  LABORATORY PANEL:   CBC  Recent Labs Lab 07/03/16 1346  WBC 27.9*  HGB 18.4*  HCT 52.7*  PLT 229   ------------------------------------------------------------------------------------------------------------------  Chemistries   Recent Labs Lab 07/03/16 1252  NA 138  K 4.0  CL 93*  CO2 19*  GLUCOSE 183*  BUN 53*  CREATININE 4.79*  CALCIUM 11.4*  AST 53*  ALT 38  ALKPHOS 102  BILITOT 2.0*   ------------------------------------------------------------------------------------------------------------------  Cardiac Enzymes No results for input(s): TROPONINI in the last 168 hours. 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 Sagittal and coronal MPR images reconstructed from axial data set. Patient drank dilute oral contrast for exam. COMPARISON:  None FINDINGS: Lower chest:  Lung base clear Hepatobiliary: Liver and gallbladder unremarkable for technique. Pancreas: Normal appearance Spleen: Normal appearance Adrenals/Urinary Tract: Adrenal glands normal appearance.  Kidneys, ureters, and bladder normal appearance. Stomach/Bowel: Normal appendix. Stomach and bowel loops normal appearance. Vascular/Lymphatic: Pattern normal caliber. Scattered normal size mesenteric nodes. No adenopathy. Reproductive: N/A Other: No free air or free fluid. Musculoskeletal: Normal appearance IMPRESSION: No acute abnormalities. Electronically Signed   By: Ulyses Southward M.D.   On: 07/03/2016 16:28    EKG:  Sinus tachycardia  IMPRESSION AND PLAN:    Rashaad Hallstrom  is a 20 y.o. male with a known history of IBS and severe acid reflux comes to the emergency room accompanied by mother and girlfriend with intractable nausea and vomiting and cramping all over the body including abdominal pain and coffee-ground emesis since 1 day. 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  1. Severe dehydration/severe acute renal failure secondary to heat stroke -Patient presented with intractable nausea vomiting cramping all over the body including abdominal cramping and elevated creatinine of 4.79 -Admit to CCU stepdown -Aggressive IV fluid hydration with IV bicarbonate and IV normal saline. This was discussed with Dr. Wynelle Link -Monitor I's and O's -Metabolic panel every 4-6 hourly -Avoid nephrotoxins  2. Leukocytosis -Appears reactive in the setting of #1 -So far no source of infection identified. CT abdomen essentially negative -Urinalysis still pending  3. Rhabdomyolysis acute secondary to #1 -Monitor CPK -Aggressive IV fluid hydration -UA still pending  4. Intractable nausea vomiting with intermittent coffee-ground emesis appears secondary to acute gastritis in the setting of #1 -IV Protonix twice a day -Consider GI consultation if needed  5. DVT prophylaxis SCD and teds -We'll avoid antiplatelet in the setting of coffee-ground emesis  6. Tobacco abuse counselled  smoking cessation for 4 mins  I will was discussed with patient patient's  friend and mother were present emergency room Case was discussed with nephrologist.  All the records are reviewed and case discussed with ED provider. Management plans discussed with the patient, family and they are in agreement.  CODE STATUS: Full  TOTAL CRITICAL TIME TAKING CARE OF THIS PATIENT: 50 minutes.    Shogo Larkey M.D on 07/03/2016 at 5:24 PM  Between 7am to 6pm - Pager - [phone]  After 6pm go to [email or web address] - password [email or web address] [facility]  [facility]  Office  [phone]  CC: Primary care physician; Judee ClaraSmith, Caroline E, RN   ++++++

## 2016-07-03 NOTE — ED Notes (Signed)
Vomiting followed by abdominal pain. No BM since yesterday.

## 2016-07-03 NOTE — ED Notes (Signed)
Called floor to let them know pt on the way 

## 2016-07-04 LAB — CBC
HEMATOCRIT: 41.4 % (ref 40.0–52.0)
Hemoglobin: 14.5 g/dL (ref 13.0–18.0)
MCH: 29.9 pg (ref 26.0–34.0)
MCHC: 35 g/dL (ref 32.0–36.0)
MCV: 85.3 fL (ref 80.0–100.0)
PLATELETS: 153 10*3/uL (ref 150–440)
RBC: 4.86 MIL/uL (ref 4.40–5.90)
RDW: 14.1 % (ref 11.5–14.5)
WBC: 18.1 10*3/uL — AB (ref 3.8–10.6)

## 2016-07-04 LAB — BASIC METABOLIC PANEL
ANION GAP: 7 (ref 5–15)
BUN: 31 mg/dL — ABNORMAL HIGH (ref 6–20)
CHLORIDE: 104 mmol/L (ref 101–111)
CO2: 31 mmol/L (ref 22–32)
Calcium: 9.2 mg/dL (ref 8.9–10.3)
Creatinine, Ser: 1.12 mg/dL (ref 0.61–1.24)
GFR calc non Af Amer: >60 mL/min (ref >60)
Glucose, Bld: 120 mg/dL — ABNORMAL HIGH (ref 65–99)
POTASSIUM: 3.9 mmol/L (ref 3.5–5.1)
SODIUM: 142 mmol/L (ref 135–145)

## 2016-07-04 MED ORDER — DICYCLOMINE HCL 10 MG PO CAPS
10.0000 mg | ORAL_CAPSULE | Freq: Three times a day (TID) | ORAL | Status: DC
  Administered 2016-07-04 (×2): 10 mg via ORAL
  Filled 2016-07-04 (×3): qty 1

## 2016-07-04 MED ORDER — NICOTINE 21 MG/24HR TD PT24
21.0000 mg | MEDICATED_PATCH | Freq: Every day | TRANSDERMAL | Status: DC
  Administered 2016-07-04: 21 mg via TRANSDERMAL
  Filled 2016-07-04: qty 1

## 2016-07-04 MED ORDER — PANTOPRAZOLE SODIUM 40 MG PO TBEC
40.0000 mg | DELAYED_RELEASE_TABLET | Freq: Two times a day (BID) | ORAL | Status: DC
  Administered 2016-07-04: 40 mg via ORAL
  Filled 2016-07-04 (×2): qty 1

## 2016-07-04 MED ORDER — METHYLPREDNISOLONE SODIUM SUCC 40 MG IJ SOLR
40.0000 mg | Freq: Once | INTRAMUSCULAR | Status: AC
  Administered 2016-07-04: 40 mg via INTRAVENOUS
  Filled 2016-07-04: qty 1

## 2016-07-04 NOTE — Progress Notes (Signed)
Pt arrived to floor AAox4.pt denies pain and nausea at present. Pt cont on IVF's . Rested well this shift . Further assessment via flow sheet

## 2016-07-04 NOTE — Progress Notes (Signed)
Patient stated that throat feels better since after I have given him solumedrol. Swallowed drinks without difficulty. No signs of acute distress. Continue to monitor.

## 2016-07-04 NOTE — Progress Notes (Signed)
[facility] - [place] at [facility]   PATIENT NAME: Matthew Christian    MR#:  [id number]  DATE OF BIRTH:  04-12-1996  SUBJECTIVE:  CHIEF COMPLAINT:   Chief Complaint  Patient presents with  . Emesis  . Near Syncope   - Patient admitted with heat stroke resulting in dehydration and acute renal failure. -Still has some nausea and abdominal pain. Much improved labs and kidney function today.  REVIEW OF SYSTEMS:  Review of Systems  Constitutional: Positive for malaise/fatigue. Negative for fever and chills.  HENT: Negative for ear discharge, ear pain and nosebleeds.   Eyes: Negative for blurred vision and double vision.  Respiratory: Negative for cough, shortness of breath and wheezing.   Cardiovascular: Negative for chest pain, palpitations and leg swelling.  Gastrointestinal: Positive for nausea and abdominal pain. Negative for vomiting, diarrhea and constipation.  Genitourinary: Negative for dysuria and urgency.  Musculoskeletal: Positive for myalgias.  Neurological: Positive for weakness. Negative for dizziness, sensory change, speech change, focal weakness, seizures and headaches.  Psychiatric/Behavioral: Negative for depression.    DRUG ALLERGIES:  No Known Allergies  VITALS:  Blood pressure 107/55, pulse 60, temperature 98.4 F (36.9 C), temperature source Oral, resp. rate 16, height  (1.778 m), weight 60.4 kg (133 lb 2.5 oz), SpO2 96 %.  PHYSICAL EXAMINATION:  Physical Exam  GENERAL:  20 y.o.-year-old patient lying in the bed with no acute distress.  EYES: Pupils equal, round, reactive to light and accommodation. No scleral icterus. Extraocular muscles intact.  HEENT: Head atraumatic, normocephalic. Oropharynx and nasopharynx clear.  NECK:  Supple, no jugular venous distention. No thyroid enlargement, no tenderness.  LUNGS: Normal breath sounds bilaterally, no wheezing, rales,rhonchi or crepitation. No use of accessory muscles of  respiration.  CARDIOVASCULAR: S1, S2 normal. No murmurs, rubs, or gallops.  ABDOMEN: Soft, Nontender except some discomfort on palpation without any guarding or rigidity, nondistended. Bowel sounds present. No organomegaly or mass.  EXTREMITIES: No pedal edema, cyanosis, or clubbing.  NEUROLOGIC: Cranial nerves II through XII are intact. Muscle strength 5/5 in all extremities. Sensation intact. Gait not checked.  PSYCHIATRIC: The patient is alert and oriented x 3.  SKIN: No obvious rash, lesion, or ulcer.    LABORATORY PANEL:   CBC  Recent Labs Lab 07/04/16 0547  WBC 18.1*  HGB 14.5  HCT 41.4  PLT 153   ------------------------------------------------------------------------------------------------------------------  Chemistries   Recent Labs Lab 07/03/16 1252  07/04/16 0547  NA 138  < > 142  K 4.0  < > 3.9  CL 93*  < > 104  CO2 19*  < > 31  GLUCOSE 183*  < > 120*  BUN 53*  < > 31*  CREATININE 4.79*  < > 1.12  CALCIUM 11.4*  < > 9.2  AST 53*  --   --   ALT 38  --   --   ALKPHOS 102  --   --   BILITOT 2.0*  --   --   < > = values in this interval not displayed. ------------------------------------------------------------------------------------------------------------------  Cardiac Enzymes No results for input(s): TROPONINI in the last 168 hours. 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 Sagittal and  coronal MPR images reconstructed from axial data set. Patient drank dilute oral contrast for exam. COMPARISON:   None FINDINGS: Lower chest:  Lung base clear Hepatobiliary: Liver and gallbladder unremarkable for technique. Pancreas: Normal appearance Spleen: Normal appearance Adrenals/Urinary Tract: Adrenal glands normal appearance. Kidneys, ureters, and bladder normal appearance. Stomach/Bowel: Normal appendix. Stomach and bowel loops normal appearance. Vascular/Lymphatic: Pattern normal caliber. Scattered normal size mesenteric nodes. No adenopathy. Reproductive: N/A Other: No free air or free fluid. Musculoskeletal: Normal appearance IMPRESSION: No acute abnormalities. Electronically Signed   By: Ulyses SouthwardMark  Boles M.D.   On: 07/03/2016 16:28    EKG:   Orders placed or performed during the hospital encounter of 07/03/16  . ED EKG  . ED EKG    ASSESSMENT AND PLAN:   20 year old male with past medical history significant for IBS, GERD presents to the hospital secondary to heatstroke.  #1 acute renal failure-secondary to prerenal, dehydration and heatstroke. -Continue IV fluids. -CT of the abdomen with completely normal kidneys. -Renal function almost back to normal. No indication for nephrology consult. -Continue to monitor I's and O's at this time  #2 leukocytosis-likely reactive. No evidence of any infection. -Received a dose of vancomycin and Zosyn in the ER. WBC improving. Continue to monitor without antibiotics.  #3 Rhabdomyolysis-secondary to acute heatstroke -IV fluids and monitor CPK  #4 IBS-causing intermittent abdominal pain. CT of the abdomen is negative for any acute intra-abdominal pathology. -Added Bentyl  #5 GERD- continue protonix for now - on full liquid diet- advance as tolerated  #6 DVT Prophylaxis- TEDs and SCDs and encourage ambulation     All the records are reviewed and case discussed with Care Management/Social Workerr. Management plans discussed with the patient, family and they are in agreement.  CODE STATUS: Full code  TOTAL TIME TAKING CARE OF THIS PATIENT: 37  minutes.   POSSIBLE D/C TOMORROW, DEPENDING ON CLINICAL CONDITION.   Enid BaasKALISETTI,Amalie Koran M.D on 07/04/2016 at 8:07 AM  Between 7am to 6pm - Pager - [phone]  After 6pm go to [email or web address] - password [email or web address] [facility]  [facility]  Office  [phone]  CC: Primary care physician; Judee ClaraSmith, Caroline E, RN

## 2016-07-04 NOTE — Care Management (Signed)
Patient admitted with acute renal failure-secondary to prerenal, dehydration and heatstroke. Lives at home with his family.  Independent.  Employed, however uninsured.  Patient [facility] that [facility].  No home Medications.    [facility] and Medication Management application provided should the patient needed.  RNCM following for discharge meds

## 2016-07-04 NOTE — Progress Notes (Signed)
  Patient c/o difficulty swallowing a large volume of liquid drink Patient " my throat is swelling? The sensation started this am but it's getting worse. No signs of acute distress at this time. No pain. VS stable. Dr. Nemiah CommanderKalisetti was notified of patient's c/o difficulty swallowing and " swollen throat" . We will continue to monitor closely. Bentyl med discontinued per MD.

## 2016-07-05 LAB — CBC
HCT: 39.3 % — ABNORMAL LOW (ref 40.0–52.0)
HEMOGLOBIN: 13.7 g/dL (ref 13.0–18.0)
MCH: 30.4 pg (ref 26.0–34.0)
MCHC: 34.8 g/dL (ref 32.0–36.0)
MCV: 87.3 fL (ref 80.0–100.0)
Platelets: 126 10*3/uL — ABNORMAL LOW (ref 150–440)
RBC: 4.5 MIL/uL (ref 4.40–5.90)
RDW: 14.1 % (ref 11.5–14.5)
WBC: 12 10*3/uL — ABNORMAL HIGH (ref 3.8–10.6)

## 2016-07-05 LAB — BASIC METABOLIC PANEL
ANION GAP: 5 (ref 5–15)
BUN: 19 mg/dL (ref 6–20)
CHLORIDE: 108 mmol/L (ref 101–111)
CO2: 26 mmol/L (ref 22–32)
CREATININE: 0.75 mg/dL (ref 0.61–1.24)
Calcium: 9 mg/dL (ref 8.9–10.3)
GFR calc non Af Amer: >60 mL/min (ref >60)
GLUCOSE: 109 mg/dL — AB (ref 65–99)
Potassium: 4.5 mmol/L (ref 3.5–5.1)
Sodium: 139 mmol/L (ref 135–145)

## 2016-07-05 LAB — CK: Total CK: 499 U/L — ABNORMAL HIGH (ref 49–397)

## 2016-07-05 MED ORDER — ONDANSETRON 4 MG PO TBDP: 4.0000 mg | ORAL_TABLET | Freq: Three times a day (TID) | ORAL | Status: DC | PRN

## 2016-07-05 NOTE — Discharge Instructions (Signed)
1. Stay hydrated 2. Stay indoors with extreme heat temperatures 3. Off from work for 4 days

## 2016-07-05 NOTE — Discharge Summary (Signed)
[facility] Physicians -  at [facility]   PATIENT NAME: Matthew Christian    MR#:  [id number]  DATE OF BIRTH:  18-Oct-1996  DATE OF ADMISSION:  07/03/2016 ADMITTING PHYSICIAN: Enedina Finner, MD  DATE OF DISCHARGE: 07/05/2016  9:04 AM  PRIMARY CARE PHYSICIAN: Judee Clara, RN    ADMISSION DIAGNOSIS:  Non-traumatic rhabdomyolysis [M62.82] Abdominal pain, unspecified abdominal location [R10.9] Sepsis, due to unspecified organism [facility]) [A41.9] Acute renal failure, unspecified acute renal failure type (HCC) [N17.9] Non-intractable vomiting with nausea, vomiting of unspecified type [R11.2]  DISCHARGE DIAGNOSIS:  Active Problems:   Acute renal failure (ARF) (HCC)   SECONDARY DIAGNOSIS:   Past Medical History  Diagnosis Date  . Abdominal pain, recurrent   . IBS (irritable bowel syndrome)   . Pica in adults     HOSPITAL COURSE:   20 year old male with past medical history significant for IBS, GERD presents to the hospital secondary to heatstroke.  #1 acute renal failure-secondary to prerenal, dehydration and heatstroke. -Improved with IV fluids -CT of the abdomen with completely normal kidneys.  #2 leukocytosis-likely reactive. No evidence of any infection. -Received a dose of vancomycin and Zosyn in the ER. WBC improved.  -Indication to continue antibiotics.  #3 Rhabdomyolysis-secondary to acute heatstroke -With IV Fluids, CPK almost normalized  #4 IBS-causing intermittent abdominal pain. CT of the abdomen is negative for any acute intra-abdominal pathology. -Received Bentyl in the hospital, symptoms resolved. However due to a concern allergic reaction, but until discontinued.  #5 GERD- advised to take over-the-counter Prilosec if symptoms were to return.  Patient is back to baseline and is being discharged home.  DISCHARGE CONDITIONS:   Stable  CONSULTS OBTAINED:   none  DRUG ALLERGIES:  No Known Allergies  DISCHARGE MEDICATIONS:    Discharge Medication List as of 07/05/2016  8:14 AM    START taking these medications   Details  ondansetron (ZOFRAN ODT) 4 MG disintegrating tablet Take 1 tablet (4 mg total) by mouth every 8 (eight) hours as needed for nausea or vomiting., Starting 07/05/2016, Until Discontinued, Print      STOP taking these medications     pantoprazole (PROTONIX) 40 MG tablet          DISCHARGE INSTRUCTIONS:   1. PCP follow-up appointments 2 weeks 2. Advised to stay indoors and avoid extreme heat conditions. 3. Advised to stay hydrated  If you experience worsening of your admission symptoms, develop shortness of breath, life threatening emergency, suicidal or homicidal thoughts you must seek medical attention immediately by calling 911 or calling your MD immediately  if symptoms less severe.  You Must read complete instructions/literature along with all the possible adverse reactions/side effects for all the Medicines you take and that have been prescribed to you. Take any new Medicines after you have completely understood and accept all the possible adverse reactions/side effects.   Please note  You were cared for by a hospitalist during your hospital stay. If you have any questions about your discharge medications or the care you received while you were in the hospital after you are discharged, you can call the unit and asked to speak with the hospitalist on call if the hospitalist that took care of you is not available. Once you are discharged, your primary care physician will handle any further medical issues. Please note that NO REFILLS for any discharge medications will be authorized once you are discharged, as it is imperative that you return to your primary care physician (or establish  a relationship with a primary care physician if you do not have one) for your aftercare needs so that they can reassess your need for medications and monitor your lab values.    Today   CHIEF COMPLAINT:    Chief Complaint  Patient presents with  . Emesis  . Near Syncope    VITAL SIGNS:  Blood pressure 95/45, pulse 48, temperature 98 F (36.7 C), temperature source Oral, resp. rate 20, height 5\' 10"  (1.778 m), weight 60.4 kg (133 lb 2.5 oz), SpO2 98 %.  I/O:   Intake/Output Summary (Last 24 hours) at 07/05/16 1404 Last data filed at 07/05/16 0803  Gross per 24 hour  Intake   1401 ml  Output    950 ml  Net    451 ml    PHYSICAL EXAMINATION:   Physical Exam  GENERAL: 20 y.o.-year-old patient lying in the bed with no acute distress.  EYES: Pupils equal, round, reactive to light and accommodation. No scleral icterus. Extraocular muscles intact.  HEENT: Head atraumatic, normocephalic. Oropharynx and nasopharynx clear.  NECK: Supple, no jugular venous distention. No thyroid enlargement, no tenderness.  LUNGS: Normal breath sounds bilaterally, no wheezing, rales,rhonchi or crepitation. No use of accessory muscles of respiration.  CARDIOVASCULAR: S1, S2 normal. No murmurs, rubs, or gallops.  ABDOMEN: Soft, Nontender except some discomfort on palpation without any guarding or rigidity, nondistended. Bowel sounds present. No organomegaly or mass.  EXTREMITIES: No pedal edema, cyanosis, or clubbing.  NEUROLOGIC: Cranial nerves II through XII are intact. Muscle strength 5/5 in all extremities. Sensation intact. Gait not checked.  PSYCHIATRIC: The patient is alert and oriented x 3.  SKIN: No obvious rash, lesion, or ulcer.   DATA REVIEW:   CBC  Recent Labs Lab 07/05/16 0403  WBC 12.0*  HGB 13.7  HCT 39.3*  PLT 126*    Chemistries   Recent Labs Lab 07/03/16 1252  07/05/16 0403  NA 138  < > 139  K 4.0  < > 4.5  CL 93*  < > 108  CO2 19*  < > 26  GLUCOSE 183*  < > 109*  BUN 53*  < > 19  CREATININE 4.79*  < > 0.75  CALCIUM 11.4*  < > 9.0  AST 53*  --   --   ALT 38  --   --   ALKPHOS 102  --   --   BILITOT 2.0*  --   --   < > = values in this interval not  displayed.  Cardiac Enzymes No results for input(s): TROPONINI in the last 168 hours.  Microbiology Results  Results for orders placed or performed during the hospital encounter of 07/03/16  Blood culture (routine x 2)     Status: None (Preliminary result)   Collection Time: 07/03/16  1:46 PM  Result Value Ref Range Status   Specimen Description BLOOD LEFT ASSIST CONTROL  Final   Special Requests BOTTLES DRAWN AEROBIC AND ANAEROBIC 8CC  Final   Culture NO GROWTH 2 DAYS  Final   Report Status PENDING  Incomplete  Blood culture (routine x 2)     Status: None (Preliminary result)   Collection Time: 07/03/16  1:46 PM  Result Value Ref Range Status   Specimen Description BLOOD RIGHT ASSIST CONTROL  Final   Special Requests BOTTLES DRAWN AEROBIC AND ANAEROBIC 5 CC  Final   Culture NO GROWTH 2 DAYS  Final   Report Status PENDING  Incomplete  MRSA PCR Screening  Status: None   Collection Time: 07/03/16  8:23 PM  Result Value Ref Range Status   MRSA by PCR NEGATIVE NEGATIVE Final    Comment:        The GeneXpert MRSA Assay (FDA approved for NASAL specimens only), is one component of a comprehensive MRSA colonization surveillance program. It is not intended to diagnose MRSA infection nor to guide or monitor treatment for MRSA infections.     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 Sagittal and coronal MPR images reconstructed from axial data set. Patient drank dilute oral contrast for exam. COMPARISON:  None FINDINGS: Lower chest:  Lung base clear Hepatobiliary: Liver and gallbladder unremarkable for technique. Pancreas: Normal appearance  Spleen: Normal appearance Adrenals/Urinary Tract: Adrenal glands normal appearance. Kidneys, ureters, and bladder normal appearance. Stomach/Bowel: Normal appendix. Stomach and bowel loops normal appearance. Vascular/Lymphatic: Pattern normal caliber. Scattered normal size mesenteric nodes. No adenopathy. Reproductive: N/A Other: No free air or free fluid. Musculoskeletal: Normal appearance IMPRESSION: No acute abnormalities. Electronically Signed   By: Ulyses SouthwardMark  Boles M.D.   On: 07/03/2016 16:28    EKG:   Orders placed or performed during the hospital encounter of 07/03/16  . ED EKG  . ED EKG      Management plans discussed with the patient, family and they are in agreement.  CODE STATUS:  Code Status History    Date Active Date Inactive Code Status Order ID Comments User Context   07/03/2016  8:08 PM 07/05/2016 12:05 PM Full Code [id number]  Enedina FinnerSona Patel, MD Inpatient      TOTAL TIME TAKING CARE OF THIS PATIENT: 37 minutes.    Enid BaasKALISETTI,Paizleigh Wilds M.D on 07/05/2016 at 2:04 PM  Between 7am to 6pm - Pager - [phone]  After 6pm go to [email or web address] - password [email or web address] [facility]  [facility]  Office  [phone]  CC: Primary care physician; Judee ClaraSmith, Caroline E, RN

## 2016-07-05 NOTE — Care Management Note (Signed)
Case Management Note  Patient Details  Name: Matthew Christian MRN: [id number] Date of Birth: [birth year]  Subjective/Objective:   Coupon from [email or web address] [place] Zo[place]an printed out for uninsured [name].                 Action/Plan:   Expected Discharge Date:                  Expected Discharge Plan:     In-House Referral:     Discharge planning Services     Post Acute Care Choice:    Choice offered to:     DME Arranged:    DME Agency:     HH Arranged:    HH Agency:     Status of Service:     If discussed at [employer] Length of Stay Meetings, dates discussed:    Additional Comments:  [name] A, RN 07/05/2016, 8:29 AM

## 2016-07-05 NOTE — Progress Notes (Signed)
Alert and oriented. Vital signs stable . No signs of acute distress. Discharge instructions given. Patient verbalizes understanding. No other issues noted at this time.   

## 2016-07-08 LAB — CULTURE, BLOOD (ROUTINE X 2)
CULTURE: NO GROWTH
CULTURE: NO GROWTH

## 2018-08-05 ENCOUNTER — Encounter: Payer: Self-pay | Admitting: Emergency Medicine

## 2018-08-05 ENCOUNTER — Inpatient Hospital Stay
Admission: EM | Admit: 2018-08-05 | Discharge: 2018-08-07 | DRG: 923 | Disposition: A | Discharge status: Home / Self Care | Payer: Self-pay | Attending: Internal Medicine | Admitting: Internal Medicine

## 2018-08-05 ENCOUNTER — Other Ambulatory Visit: Payer: Self-pay

## 2018-08-05 DIAGNOSIS — R17 Unspecified jaundice: Secondary | ICD-10-CM | POA: Diagnosis present

## 2018-08-05 DIAGNOSIS — E860 Dehydration: Secondary | ICD-10-CM | POA: Diagnosis present

## 2018-08-05 DIAGNOSIS — T675XXA Heat exhaustion, unspecified, initial encounter: Principal | ICD-10-CM | POA: Diagnosis present

## 2018-08-05 DIAGNOSIS — N179 Acute kidney failure, unspecified: Secondary | ICD-10-CM | POA: Diagnosis present

## 2018-08-05 DIAGNOSIS — R112 Nausea with vomiting, unspecified: Secondary | ICD-10-CM | POA: Diagnosis present

## 2018-08-05 DIAGNOSIS — R339 Retention of urine, unspecified: Secondary | ICD-10-CM

## 2018-08-05 DIAGNOSIS — R739 Hyperglycemia, unspecified: Secondary | ICD-10-CM | POA: Diagnosis present

## 2018-08-05 DIAGNOSIS — E8809 Other disorders of plasma-protein metabolism, not elsewhere classified: Secondary | ICD-10-CM | POA: Diagnosis present

## 2018-08-05 DIAGNOSIS — F17210 Nicotine dependence, cigarettes, uncomplicated: Secondary | ICD-10-CM | POA: Diagnosis present

## 2018-08-05 DIAGNOSIS — X30XXXA Exposure to excessive natural heat, initial encounter: Secondary | ICD-10-CM

## 2018-08-05 DIAGNOSIS — R740 Nonspecific elevation of levels of transaminase and lactic acid dehydrogenase [LDH]: Secondary | ICD-10-CM | POA: Diagnosis present

## 2018-08-05 DIAGNOSIS — R6510 Systemic inflammatory response syndrome (SIRS) of non-infectious origin without acute organ dysfunction: Secondary | ICD-10-CM | POA: Diagnosis present

## 2018-08-05 LAB — COMPREHENSIVE METABOLIC PANEL WITH GFR
ALT: 33 U/L (ref 0–44)
AST: 43 U/L — ABNORMAL HIGH (ref 15–41)
Albumin: 6.9 g/dL — ABNORMAL HIGH (ref 3.5–5.0)
Alkaline Phosphatase: 86 U/L (ref 38–126)
Anion gap: 17 — ABNORMAL HIGH (ref 5–15)
BUN: 17 mg/dL (ref 6–20)
CO2: 21 mmol/L — ABNORMAL LOW (ref 22–32)
Calcium: 11.8 mg/dL — ABNORMAL HIGH (ref 8.9–10.3)
Chloride: 101 mmol/L (ref 98–111)
Creatinine, Ser: 2.52 mg/dL — ABNORMAL HIGH (ref 0.61–1.24)
GFR calc Af Amer: 40 mL/min — ABNORMAL LOW
GFR calc non Af Amer: 34 mL/min — ABNORMAL LOW
Glucose, Bld: 174 mg/dL — ABNORMAL HIGH (ref 70–99)
Potassium: 4.4 mmol/L (ref 3.5–5.1)
Sodium: 139 mmol/L (ref 135–145)
Total Bilirubin: 2.4 mg/dL — ABNORMAL HIGH (ref 0.3–1.2)
Total Protein: 10.5 g/dL — ABNORMAL HIGH (ref 6.5–8.1)

## 2018-08-05 LAB — BLOOD GAS, VENOUS
Acid-base deficit: 1.2 mmol/L (ref 0.0–2.0)
Bicarbonate: 25.8 mmol/L (ref 20.0–28.0)
O2 Saturation: 55.4 %
PCO2 VEN: 50 mmHg (ref 44.0–60.0)
PH VEN: 7.32 (ref 7.250–7.430)
PO2 VEN: 32 mmHg (ref 32.0–45.0)
Patient temperature: 37

## 2018-08-05 LAB — CBC
HCT: 56.1 % — ABNORMAL HIGH (ref 40.0–52.0)
Hemoglobin: 19.5 g/dL — ABNORMAL HIGH (ref 13.0–18.0)
MCH: 30.5 pg (ref 26.0–34.0)
MCHC: 34.8 g/dL (ref 32.0–36.0)
MCV: 87.8 fL (ref 80.0–100.0)
Platelets: 313 K/uL (ref 150–440)
RBC: 6.39 MIL/uL — ABNORMAL HIGH (ref 4.40–5.90)
RDW: 14.5 % (ref 11.5–14.5)
WBC: 26.7 K/uL — ABNORMAL HIGH (ref 3.8–10.6)

## 2018-08-05 LAB — CK: CK TOTAL: 234 U/L (ref 49–397)

## 2018-08-05 LAB — LIPASE, BLOOD: Lipase: 23 U/L (ref 11–51)

## 2018-08-05 LAB — MAGNESIUM: MAGNESIUM: 2.3 mg/dL (ref 1.7–2.4)

## 2018-08-05 MED ORDER — SODIUM CHLORIDE 0.9 % IV BOLUS
1000.0000 mL | Freq: Once | INTRAVENOUS | Status: AC
  Administered 2018-08-05: 1000 mL via INTRAVENOUS

## 2018-08-05 MED ORDER — PROMETHAZINE HCL 25 MG/ML IJ SOLN
25.0000 mg | Freq: Once | INTRAMUSCULAR | Status: AC
  Administered 2018-08-05: 25 mg via INTRAVENOUS
  Filled 2018-08-05: qty 1

## 2018-08-05 MED ORDER — SODIUM CHLORIDE 0.45 % IV SOLN
INTRAVENOUS | Status: DC
  Administered 2018-08-06: 01:00:00 via INTRAVENOUS

## 2018-08-05 NOTE — ED Provider Notes (Signed)
[facility] Emergency Department Provider Note  Time seen: 9:53 PM  I have reviewed the triage vital signs and the nursing notes.   HISTORY  Chief Complaint Dehydration    HPI Matthew Christian is a 22 y.o. male for the past medical history of IBS presents to the emergency department for nausea vomiting generalized weakness and cramps.  According to the patient has a history of IBS and he is used to nausea and vomiting on a very frequent basis.  However he states since 3 PM today he has been vomiting unable to stop vomiting cannot keep down any fluids.  Denies any diarrhea.  Denies any fever.  Denies any abdominal pain or chest pain.  States he is having muscle cramps all over his body.  Describes as cramps as moderate.  States he feels very dehydrated with a dry mouth.   Past Medical History:  Diagnosis Date  . Abdominal pain, recurrent   . IBS (irritable bowel syndrome)   . Pica in adults     Patient Active Problem List   Diagnosis Date Noted  . Acute renal failure (ARF) (HCC) 07/03/2016  . Diarrhea 04/08/2012  . Halitosis 04/08/2012  . Nausea 04/08/2012  . Pica in adults 04/08/2012  . Epigastric abdominal pain     History reviewed. No pertinent surgical history.  Prior to Admission medications   Medication Sig Start Date End Date Taking? Authorizing Provider  ondansetron (ZOFRAN ODT) 4 MG disintegrating tablet Take 1 tablet (4 mg total) by mouth every 8 (eight) hours as needed for nausea or vomiting. 07/05/16   Enid BaasKalisetti, Radhika, MD    No Known Allergies  Family History  Adopted: Yes  Problem Relation Age of Onset  . Irritable bowel syndrome Mother     Social History Social History   Tobacco Use  . Smoking status: Current Every Day Smoker    Types: Cigarettes  . Smokeless tobacco: Current User    Types: Snuff, Chew  Substance Use Topics  . Alcohol use: No  . Drug use: No    Review of Systems Constitutional: Negative for fever.   Feels dehydrated and thirsty Eyes: Negative for visual complaints ENT: Dry mouth. Cardiovascular: Negative for chest pain. Respiratory: Negative for shortness of breath. Gastrointestinal: Negative for abdominal pain and frequent vomiting.  Negative for diarrhea Genitourinary: Negative for urinary compaints Musculoskeletal: Negative for musculoskeletal complaints Skin: Negative for skin complaints  Neurological: Negative for headache All other ROS negative  ____________________________________________   PHYSICAL EXAM:  VITAL SIGNS: ED Triage Vitals  Enc Vitals Group     BP 08/05/18 2119 (!) 103/34     Pulse Rate 08/05/18 2119 (!) 102     Resp 08/05/18 2119 18     Temp 08/05/18 2119 (!) 97.5 F (36.4 C)     Temp Source 08/05/18 2119 Oral     SpO2 08/05/18 2119 99 %     Weight 08/05/18 2112 146 lb (66.2 kg)     Height 08/05/18 2112 5\' 11"  (1.803 m)     Head Circumference --      Peak Flow --      Pain Score 08/05/18 2112 0     Pain Loc --      Pain Edu? --      Excl. in GC? --    Constitutional: Alert and oriented. Well appearing and in no distress. Eyes: Sunken appearing eyes ENT   Head: Normocephalic and atraumatic   Mouth/Throat: Dry mucous membranes Cardiovascular: Normal  rate, regular rhythm. No murmur Respiratory: Normal respiratory effort without tachypnea nor retractions. Breath sounds are clear Gastrointestinal: Soft and nontender. No distention.   Musculoskeletal: Nontender with normal range of motion in all extremities. No lower extremity tenderness . Neurologic:  Normal speech and language. No gross focal neurologic deficits  Skin:  Skin is warm, dry and intact.  Psychiatric: Mood and affect are normal.   ____________________________________________   INITIAL IMPRESSION / ASSESSMENT AND PLAN / ED COURSE  Pertinent labs & imaging results that were available during my care of the patient were reviewed by me and considered in my medical decision  making (see chart for details).  Patient presents emergency department nausea vomiting since 3 PM today unable to keep down any fluids.  States generalized cramping and body aches.  Differential would include gastritis, intractable nausea vomiting, intra-abdominal pathology, electrolyte or metabolic abnormality, dehydration.  We will check labs, IV hydrate, treat with Phenergan and continue to closely monitor.  Patient's labs are resulted with significant abnormalities including a significant leukocytosis at 26,000 as well as hemoconcentrated blood with a hemoglobin of 19.5 indicative of significant dehydration.  Creatinine has gone from 0.7 to now 2.2 suggesting acute renal failure could also be due to significant dehydration.  Patient did have an anion gap of 17.  We will continue with significant IV hydration, patient will require admission to the hospitalist service for continued treatment.  A VBG and CK are pending.  Patient agreeable to plan of care.  ____________________________________________   FINAL CLINICAL IMPRESSION(S) / ED DIAGNOSES  Nausea vomiting Dehydration Acute renal failure   Minna AntisPaduchowski, Oiva Dibari, MD 08/05/18 2315

## 2018-08-05 NOTE — ED Triage Notes (Signed)
Pt comes into the ED via POV c/o possible dehydration.  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  Patient is ambulatory to triage at this time and in NAD with even and unlabored respirations. Patient states he has vomited every time he drinks any fluids.

## 2018-08-06 ENCOUNTER — Inpatient Hospital Stay: Payer: Self-pay

## 2018-08-06 LAB — URINALYSIS, COMPLETE (UACMP) WITH MICROSCOPIC
Bacteria, UA: NONE SEEN
Bilirubin Urine: NEGATIVE
Glucose, UA: NEGATIVE mg/dL
Hgb urine dipstick: NEGATIVE
KETONES UR: NEGATIVE mg/dL
Leukocytes, UA: NEGATIVE
NITRITE: NEGATIVE
PH: 6 (ref 5.0–8.0)
Protein, ur: NEGATIVE mg/dL
SPECIFIC GRAVITY, URINE: 1.024 (ref 1.005–1.030)

## 2018-08-06 LAB — CBC
HCT: 47.4 % (ref 40.0–52.0)
HEMOGLOBIN: 16.6 g/dL (ref 13.0–18.0)
MCH: 31.2 pg (ref 26.0–34.0)
MCHC: 35 g/dL (ref 32.0–36.0)
MCV: 89.3 fL (ref 80.0–100.0)
Platelets: 224 10*3/uL (ref 150–440)
RBC: 5.3 MIL/uL (ref 4.40–5.90)
RDW: 14.2 % (ref 11.5–14.5)
WBC: 17.5 10*3/uL — AB (ref 3.8–10.6)

## 2018-08-06 LAB — BASIC METABOLIC PANEL
ANION GAP: 8 (ref 5–15)
BUN: 19 mg/dL (ref 6–20)
CHLORIDE: 109 mmol/L (ref 98–111)
CO2: 23 mmol/L (ref 22–32)
Calcium: 9.8 mg/dL (ref 8.9–10.3)
Creatinine, Ser: 1.41 mg/dL — ABNORMAL HIGH (ref 0.61–1.24)
GFR calc Af Amer: >60 mL/min (ref >60)
GFR calc non Af Amer: >60 mL/min (ref >60)
GLUCOSE: 132 mg/dL — AB (ref 70–99)
POTASSIUM: 5.1 mmol/L (ref 3.5–5.1)
Sodium: 140 mmol/L (ref 135–145)

## 2018-08-06 MED ORDER — BISACODYL 5 MG PO TBEC: 5.0000 mg | DELAYED_RELEASE_TABLET | Freq: Every day | ORAL | Status: DC | PRN

## 2018-08-06 MED ORDER — ACETAMINOPHEN 650 MG RE SUPP: 650.0000 mg | Freq: Four times a day (QID) | RECTAL | Status: DC | PRN

## 2018-08-06 MED ORDER — ONDANSETRON HCL 4 MG/2ML IJ SOLN: 4.0000 mg | Freq: Four times a day (QID) | INTRAMUSCULAR | Status: DC | PRN

## 2018-08-06 MED ORDER — HEPARIN SODIUM (PORCINE) 5000 UNIT/ML IJ SOLN: 5000.0000 [IU] | Freq: Three times a day (TID) | INTRAMUSCULAR | Status: DC

## 2018-08-06 MED ORDER — NICOTINE 21 MG/24HR TD PT24
21.0000 mg | MEDICATED_PATCH | Freq: Every day | TRANSDERMAL | Status: DC
  Administered 2018-08-06 – 2018-08-07 (×2): 21 mg via TRANSDERMAL
  Filled 2018-08-06 (×2): qty 1

## 2018-08-06 MED ORDER — SODIUM CHLORIDE 0.45 % IV SOLN
INTRAVENOUS | Status: DC
  Administered 2018-08-06 – 2018-08-07 (×3): via INTRAVENOUS

## 2018-08-06 MED ORDER — SENNOSIDES-DOCUSATE SODIUM 8.6-50 MG PO TABS: 1.0000 | ORAL_TABLET | Freq: Every evening | ORAL | Status: DC | PRN

## 2018-08-06 MED ORDER — ONDANSETRON HCL 4 MG PO TABS: 4.0000 mg | ORAL_TABLET | Freq: Four times a day (QID) | ORAL | Status: DC | PRN

## 2018-08-06 MED ORDER — ACETAMINOPHEN 325 MG PO TABS: 650.0000 mg | ORAL_TABLET | Freq: Four times a day (QID) | ORAL | Status: DC | PRN

## 2018-08-06 MED ORDER — PROMETHAZINE HCL 25 MG/ML IJ SOLN
12.5000 mg | Freq: Four times a day (QID) | INTRAMUSCULAR | Status: DC | PRN
  Administered 2018-08-06: 25 mg via INTRAVENOUS
  Filled 2018-08-06: qty 1

## 2018-08-06 NOTE — Progress Notes (Signed)
[facility] - [place] at [facility]   PATIENT NAME: Matthew Christian    MR#:  [id number]  DATE OF BIRTH:  07/01/1996  SUBJECTIVE:  CHIEF COMPLAINT: Patient is reporting nausea but denies any vomiting.  Denies any abdominal pain.  Did not urinate for several hours. Smokes 1 pack of cigarettes in 1 to 2 days  REVIEW OF SYSTEMS:  CONSTITUTIONAL: No fever, fatigue or weakness.  EYES: No blurred or double vision.  EARS, NOSE, AND THROAT: No tinnitus or ear pain.  RESPIRATORY: No cough, shortness of breath, wheezing or hemoptysis.  CARDIOVASCULAR: No chest pain, orthopnea, edema.  GASTROINTESTINAL: No nausea, vomiting, diarrhea or abdominal pain.  GENITOURINARY: No dysuria, hematuria.  ENDOCRINE: No polyuria, nocturia,  HEMATOLOGY: No anemia, easy bruising or bleeding SKIN: No rash or lesion. MUSCULOSKELETAL: No joint pain or arthritis.   NEUROLOGIC: No tingling, numbness, weakness.  PSYCHIATRY: No anxiety or depression.   DRUG ALLERGIES:  No Known Allergies  VITALS:  Blood pressure 115/75, pulse 61, temperature 97.6 F (36.4 C), temperature source Axillary, resp. rate 18, height 5\' 11"  (1.803 m), weight 68.2 kg, SpO2 100 %.  PHYSICAL EXAMINATION:  GENERAL:  22 y.o.-year-old patient lying in the bed with no acute distress.  EYES: Pupils equal, round, reactive to light and accommodation. No scleral icterus. Extraocular muscles intact.  HEENT: Head atraumatic, normocephalic. Oropharynx and nasopharynx clear.  Dry mucous membranes NECK:  Supple, no jugular venous distention. No thyroid enlargement, no tenderness.  LUNGS: Normal breath sounds bilaterally, no wheezing, rales,rhonchi or crepitation. No use of accessory muscles of respiration.  CARDIOVASCULAR: S1, S2 normal. No murmurs, rubs, or gallops.  ABDOMEN: Soft, nontender, nondistended. Bowel sounds present. No organomegaly or mass.  EXTREMITIES: No pedal edema, cyanosis, or clubbing.  NEUROLOGIC:  Cranial nerves II through XII are intact. Muscle strength 5/5 in all extremities. Sensation intact. Gait not checked.  PSYCHIATRIC: The patient is alert and oriented x 3.  SKIN: No obvious rash, lesion, or ulcer.    LABORATORY PANEL:   CBC Recent Labs  Lab 08/06/18 0340  WBC 17.5*  HGB 16.6  HCT 47.4  PLT 224   ------------------------------------------------------------------------------------------------------------------  Chemistries  Recent Labs  Lab 08/05/18 2127 08/06/18 0340  NA 139 140  K 4.4 5.1  CL 101 109  CO2 21* 23  GLUCOSE 174* 132*  BUN 17 19  CREATININE 2.52* 1.41*  CALCIUM 11.8* 9.8  MG 2.3  --   AST 43*  --   ALT 33  --   ALKPHOS 86  --   BILITOT 2.4*  --    ------------------------------------------------------------------------------------------------------------------  Cardiac Enzymes No results for input(s): TROPONINI in the last 168 hours. ------------------------------------------------------------------------------------------------------------------  RADIOLOGY:  No results found.  EKG:   Orders placed or performed during the hospital encounter of 07/03/16  . ED EKG  . ED EKG  . EKG    ASSESSMENT AND PLAN:   /P: 50M heat exhaustion/heat stroke, dehydration, N/V. SIRS (+). Labwork (+) hyperglycemia, AKI, hypercalcemia, hyperalbuminemia, transaminasemia, hyperproteinemia, hyperbilirubinemia, leukocytosis, polycythemia, all likely 2/2 dehydration/hemoconcentration.  #Heat exhaustion leading to dehydration Aggressive hydration with IV fluids Continue close monitoring  # AKI -prerenal with possible component of postrenal We will get renal ultrasound Renal function is slowly improving Baseline creatinine normal; creatinine 2.52-1.41 Avoid nephrotoxins and renally adjust rest of the medications  #Hypercalcemia from dehydration provide IV fluids and recheck in a.m.  #Hyperbilirubinemia probably from dehydration we will check in  a.m.  #Tobacco abuse disorder counseled patient to  quit smoking for 6 minutes.  Patient verbalized understanding will provide nicotine patch   All the records are reviewed and case discussed with Care Management/Social Workerr. Management plans discussed with the patient, family and they are in agreement.  CODE STATUS: fc   TOTAL TIME TAKING CARE OF THIS PATIENT:  35 minutes.   POSSIBLE D/C IN  DAYS, DEPENDING ON CLINICAL CONDITION.  Note: This dictation was prepared with Dragon dictation along with smaller phrase technology. Any transcriptional errors that result from this process are unintentional.   Ramonita LabAruna Pheng Prokop M.D on 08/06/2018 at 1:03 PM  Between 7am to 6pm - Pager - [phone] After 6pm go to [email or web address] - password [email or web address] [facility]  [facility]  Office  [phone]  CC: Primary care physician; Judee ClaraSmith, Caroline E, RN

## 2018-08-06 NOTE — H&P (Signed)
[facility] - [place] at [facility]   PATIENT NAME: Matthew Christian    MR#:  [id number]  DATE OF BIRTH:  1996-02-24  DATE OF ADMISSION:  08/05/2018  PRIMARY CARE PHYSICIAN: Judee ClaraSmith, Caroline E, RN   REQUESTING/REFERRING PHYSICIAN: Minna AntisPaduchowski, Kevin, MD  CHIEF COMPLAINT:   Chief Complaint  Patient presents with  . Dehydration    HISTORY OF PRESENT ILLNESS:  Matthew Christian  is a 22 y.o. male with a known history of IBS p/w heat exhaustion/heat stroke, dehydration, N/V. 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 He states that he starts work 32@~0830AM. He states that on Thursday 08/05/2018, he "overheated". He states that, apart from several short breaks, he spent much of the day working ("nearly non-stop"). @~1500PM, he states he began to sweat profusely, and developed N/V. He states that he had attempted to stay well hydrated throughout the day, but was "not able to keep up". He states he feels weak, but denies LH/LOC. He had a similar presentation in 06/2016.  PAST MEDICAL HISTORY:   Past Medical History:  Diagnosis Date  . Abdominal pain, recurrent   . IBS (irritable bowel syndrome)   . Pica in adults     PAST SURGICAL HISTORY:  History reviewed. No pertinent surgical history.  SOCIAL HISTORY:   Social History   Tobacco Use  . Smoking status: Current Every Day Smoker    Types: Cigarettes  . Smokeless tobacco: Current User    Types: Snuff, Chew  Substance Use Topics  . Alcohol use: No    FAMILY HISTORY:   Family History  Adopted: Yes  Problem Relation Age of Onset  . Irritable bowel syndrome Mother     DRUG ALLERGIES:  No Known Allergies  REVIEW OF SYSTEMS:   Review of Systems  Constitutional: Positive for diaphoresis and malaise/fatigue. Negative for chills, fever and weight loss.  HENT: Negative for congestion, ear pain, hearing loss, nosebleeds, sinus pain, sore throat and tinnitus.     Eyes: Negative for blurred vision, double vision, photophobia and discharge.  Respiratory: Negative for cough, hemoptysis, sputum production, shortness of breath and wheezing.   Cardiovascular: Negative for chest pain, palpitations, orthopnea, claudication, leg swelling and PND.  Gastrointestinal: Positive for nausea and vomiting. Negative for abdominal pain, blood in stool, constipation, diarrhea, heartburn and melena.  Genitourinary: Negative for dysuria, frequency, hematuria and urgency.  Musculoskeletal: Negative for back pain, joint pain, myalgias and neck pain.  Skin: Negative for itching and rash.  Neurological: Positive for weakness. Negative for dizziness, tingling, tremors, sensory change, speech change, focal weakness, seizures, loss of consciousness and headaches.  Psychiatric/Behavioral: Negative for memory loss. The patient does not have insomnia.    MEDICATIONS AT HOME:   Prior to Admission medications   Medication Sig Start Date End Date Taking? Authorizing Provider  ondansetron (ZOFRAN ODT) 4 MG disintegrating tablet Take 1 tablet (4 mg total) by mouth every 8 (eight) hours as needed for nausea or vomiting. Patient not taking: Reported on 08/05/2018 07/05/16   Enid BaasKalisetti, Radhika, MD      VITAL SIGNS:  Blood pressure 113/85, pulse 83, temperature (!) 97.5 F (36.4 C), temperature source Oral, resp. rate 18, height 5\' 11"  (1.803 m), weight 66.2 kg, SpO2 95 %.  PHYSICAL EXAMINATION:  Physical Exam  Constitutional: He is oriented to person, place, and time. He appears well-developed and well-nourished. He is active and cooperative.  Non-toxic appearance. He does not  have a sickly appearance. He does not appear ill. No distress. He is not intubated.  HENT:  Head: Normocephalic and atraumatic.  Mouth/Throat: Oropharynx is clear and moist. No oropharyngeal exudate.  Eyes: Conjunctivae, EOM and lids are normal. No scleral icterus.  Neck: Neck supple. No JVD present. No  thyromegaly present.  Cardiovascular: Regular rhythm, S1 normal, S2 normal and normal heart sounds.  No extrasystoles are present. Tachycardia present. Exam reveals no gallop, no S3, no S4, no distant heart sounds and no friction rub.  No murmur heard. Pulmonary/Chest: Effort normal and breath sounds normal. No accessory muscle usage or stridor. No apnea, no tachypnea and no bradypnea. He is not intubated. No respiratory distress. He has no decreased breath sounds. He has no wheezes. He has no rhonchi. He has no rales.  Abdominal: Soft. Bowel sounds are normal. He exhibits no distension. There is no tenderness. There is no rebound and no guarding.  Musculoskeletal: Normal range of motion. He exhibits no edema or tenderness.  Lymphadenopathy:    He has no cervical adenopathy.  Neurological: He is alert and oriented to person, place, and time. He is not disoriented.  Skin: Skin is warm, dry and intact. No rash noted. He is not diaphoretic. There is erythema.  Psychiatric: He has a normal mood and affect. His speech is normal and behavior is normal. Judgment and thought content normal. Cognition and memory are normal.   Appears sunburned. Tachycardic on exam. LABORATORY PANEL:   CBC Recent Labs  Lab 08/05/18 2127  WBC 26.7*  HGB 19.5*  HCT 56.1*  PLT 313   ------------------------------------------------------------------------------------------------------------------  Chemistries  Recent Labs  Lab 08/05/18 2127  NA 139  K 4.4  CL 101  CO2 21*  GLUCOSE 174*  BUN 17  CREATININE 2.52*  CALCIUM 11.8*  MG 2.3  AST 43*  ALT 33  ALKPHOS 86  BILITOT 2.4*   ------------------------------------------------------------------------------------------------------------------  Cardiac Enzymes No results for input(s): TROPONINI in the last 168 hours. ------------------------------------------------------------------------------------------------------------------  RADIOLOGY:  No  results found.  IMPRESSION AND PLAN:   A/P: 6M heat exhaustion/heat stroke, dehydration, N/V. SIRS (+). Labwork (+) hyperglycemia, AKI, hypercalcemia, hyperalbuminemia, transaminasemia, hyperproteinemia, hyperbilirubinemia, leukocytosis, polycythemia, all likely 2/2 dehydration/hemoconcentration. -Heat exhaustion/heat stroke, dehydration, N/V: Aggressive IVF, antiemetics. -SIRS: Likely 2/2 above; monitoring; do not suspect infxn at present. -AKI: Cr 2.52 on admission. Suspect prerenal AKI. IVF, monitor BMP, avoid nephrotoxins. -Hyperglycemia, hypercalcemia, hyperalbuminemia, transaminasemia, hyperproteinemia, hyperbilirubinemia, leukocytosis, polycythemia: All likely 2/2 dehydration/hemoconcentration. IVF as above, monitor for improvement. -FEN/GI: Renal diet as tolerated. -DVT PPx: Heparin. -Code Status: Full code. -Disposition: Admission, > 2 midnights.   All the records are reviewed and case discussed with ED provider. Management plans discussed with the patient, family and they are in agreement.  CODE STATUS: Full code  TOTAL TIME TAKING CARE OF THIS PATIENT: 60 minutes.    Barbaraann RondoPrasanna Brookelle Pellicane M.D on 08/06/2018 at 12:23 AM  Between 7am to 6pm - Pager - [phone]  After 6pm go to [email or web address] - Social research officer, governmentpassword EPAS [email or web address]  [facility] [place] [facility]  Office  [phone]  CC: Primary care physician; Judee ClaraSmith, Caroline E, RN   Note: This dictation was prepared with Dragon dictation along with smaller phrase technology. Any transcriptional errors that result from this process are unintentional.

## 2018-08-07 LAB — CBC
HCT: 46.3 % (ref 40.0–52.0)
Hemoglobin: 15.9 g/dL (ref 13.0–18.0)
MCH: 31 pg (ref 26.0–34.0)
MCHC: 34.3 g/dL (ref 32.0–36.0)
MCV: 90.2 fL (ref 80.0–100.0)
PLATELETS: 206 10*3/uL (ref 150–440)
RBC: 5.13 MIL/uL (ref 4.40–5.90)
RDW: 14.4 % (ref 11.5–14.5)
WBC: 11.3 10*3/uL — AB (ref 3.8–10.6)

## 2018-08-07 LAB — HIV ANTIBODY (ROUTINE TESTING W REFLEX): HIV Screen 4th Generation wRfx: NONREACTIVE

## 2018-08-07 LAB — BASIC METABOLIC PANEL
Anion gap: 5 (ref 5–15)
BUN: 17 mg/dL (ref 6–20)
CHLORIDE: 106 mmol/L (ref 98–111)
CO2: 29 mmol/L (ref 22–32)
CREATININE: 0.99 mg/dL (ref 0.61–1.24)
Calcium: 9.4 mg/dL (ref 8.9–10.3)
GFR calc non Af Amer: >60 mL/min (ref >60)
Glucose, Bld: 93 mg/dL (ref 70–99)
Potassium: 4.6 mmol/L (ref 3.5–5.1)
SODIUM: 140 mmol/L (ref 135–145)

## 2018-08-07 MED ORDER — NICOTINE 21 MG/24HR TD PT24: 21.0000 mg | MEDICATED_PATCH | Freq: Every day | TRANSDERMAL | 0 refills | Status: DC

## 2018-08-07 NOTE — Discharge Summary (Signed)
[facility] [place] at [facility]   PATIENT NAME: Matthew Christian    MR#:  [id number]  DATE OF BIRTH:  1996-08-20  DATE OF ADMISSION:  08/05/2018 ADMITTING PHYSICIAN: Barbaraann RondoPrasanna Sridharan, MD  DATE OF DISCHARGE:  08/07/18   PRIMARY CARE PHYSICIAN: Judee ClaraSmith, Caroline E, RN    ADMISSION DIAGNOSIS:  Dehydration [E86.0] Acute renal failure, unspecified acute renal failure type (HCC) [N17.9] Non-intractable vomiting with nausea, unspecified vomiting type [R11.2]  DISCHARGE DIAGNOSIS:  Active Problems:   Nausea & vomiting   SECONDARY DIAGNOSIS:   Past Medical History:  Diagnosis Date  . Abdominal pain, recurrent   . IBS (irritable bowel syndrome)   . Pica in adults     HOSPITAL COURSE:   P: 22M heat exhaustion/heat stroke, dehydration, N/V. SIRS (+). Labwork (+) hyperglycemia, AKI, hypercalcemia, hyperalbuminemia, transaminasemia, hyperproteinemia, hyperbilirubinemia, leukocytosis, polycythemia, all likely 2/2 dehydration/hemoconcentration.  #Heat exhaustion leading to dehydration Aggressive hydration with IV fluids provided patient clinically improved   # AKI -prerenal with possible component of postrenal Resolved Normal renal ultrasound Baseline creatinine normal; creatinine 2.52-1.41--0.99 back to normal Avoid nephrotoxins and renally adjust rest of the medications  #Hypercalcemia from dehydration provided IV fluids and calcium in the normal range today  #Hyperbilirubinemia probably from dehydration, normalizing  #Tobacco abuse disorder counseled patient to quit smoking for 6 minutes.  Patient verbalized understanding will provide nicotine patch  Discharge patient home  DISCHARGE CONDITIONS:   Stable  CONSULTS OBTAINED:  Treatment Team:  Barbaraann RondoSridharan, Prasanna, MD   PROCEDURES  None   DRUG ALLERGIES:  No Known Allergies  DISCHARGE MEDICATIONS:   Allergies as of 08/07/2018   No Known Allergies     Medication List    STOP  taking these medications   ondansetron 4 MG disintegrating tablet Commonly known as:  ZOFRAN-ODT     TAKE these medications   nicotine 21 mg/24hr patch Commonly known as:  NICODERM CQ - dosed in mg/24 hours Place 1 patch (21 mg total) onto the skin daily. Start taking on:  08/08/2018        DISCHARGE INSTRUCTIONS:   Follow-up with primary care physician in 5 to 7 days or sooner as needed  DIET:  Regular diet  DISCHARGE CONDITION:  Stable  ACTIVITY:  Activity as tolerated and Activity as tolerated and no driving for today  OXYGEN:  Home Oxygen: No.   Oxygen Delivery: room air  DISCHARGE LOCATION:  home   If you experience worsening of your admission symptoms, develop shortness of breath, life threatening emergency, suicidal or homicidal thoughts you must seek medical attention immediately by calling 911 or calling your MD immediately  if symptoms less severe.  You Must read complete instructions/literature along with all the possible adverse reactions/side effects for all the Medicines you take and that have been prescribed to you. Take any new Medicines after you have completely understood and accpet all the possible adverse reactions/side effects.   Please note  You were cared for by a hospitalist during your hospital stay. If you have any questions about your discharge medications or the care you received while you were in the hospital after you are discharged, you can call the unit and asked to speak with the hospitalist on call if the hospitalist that took care of you is not available. Once you are discharged, your primary care physician will handle any further medical issues. Please note that NO REFILLS for any discharge medications will be authorized once you are discharged, as it is  imperative that you return to your primary care physician (or establish a relationship with a primary care physician if you do not have one) for your aftercare needs so that they can  reassess your need for medications and monitor your lab values.     Today  Chief Complaint  Patient presents with  . Dehydration   Patient is doing fine.  Denies any nausea vomiting or body aches.  Denies any muscle cramps.  Urinating well with no nausea vomiting  ROS:  CONSTITUTIONAL: Denies fevers, chills. Denies any fatigue, weakness.  EYES: Denies blurry vision, double vision, eye pain. EARS, NOSE, THROAT: Denies tinnitus, ear pain, hearing loss. RESPIRATORY: Denies cough, wheeze, shortness of breath.  CARDIOVASCULAR: Denies chest pain, palpitations, edema.  GASTROINTESTINAL: Denies nausea, vomiting, diarrhea, abdominal pain. Denies bright red blood per rectum. GENITOURINARY: Denies dysuria, hematuria. ENDOCRINE: Denies nocturia or thyroid problems. HEMATOLOGIC AND LYMPHATIC: Denies easy bruising or bleeding. SKIN: Denies rash or lesion. MUSCULOSKELETAL: Denies pain in neck, back, shoulder, knees, hips or arthritic symptoms.  NEUROLOGIC: Denies paralysis, paresthesias.  PSYCHIATRIC: Denies anxiety or depressive symptoms.   VITAL SIGNS:  Blood pressure 98/66, pulse (!) 57, temperature 97.8 F (36.6 C), temperature source Oral, resp. rate 16, height 5\' 11"  (1.803 m), weight 68.2 kg, SpO2 100 %.  I/O:    Intake/Output Summary (Last 24 hours) at 08/07/2018 1201 Last data filed at 08/07/2018 1014 Gross per 24 hour  Intake 3445 ml  Output -  Net 3445 ml    PHYSICAL EXAMINATION:  GENERAL:  22 y.o.-year-old patient lying in the bed with no acute distress.  EYES: Pupils equal, round, reactive to light and accommodation. No scleral icterus. Extraocular muscles intact.  HEENT: Head atraumatic, normocephalic. Oropharynx and nasopharynx clear.  NECK:  Supple, no jugular venous distention. No thyroid enlargement, no tenderness.  LUNGS: Normal breath sounds bilaterally, no wheezing, rales,rhonchi or crepitation. No use of accessory muscles of respiration.  CARDIOVASCULAR: S1, S2  normal. No murmurs, rubs, or gallops.  ABDOMEN: Soft, non-tender, non-distended. Bowel sounds present. No organomegaly or mass.  EXTREMITIES: No pedal edema, cyanosis, or clubbing.  NEUROLOGIC: Cranial nerves II through XII are intact. Muscle strength 5/5 in all extremities. Sensation intact. Gait not checked.  PSYCHIATRIC: The patient is alert and oriented x 3.  SKIN: No obvious rash, lesion, or ulcer.   DATA REVIEW:   CBC Recent Labs  Lab 08/07/18 0405  WBC 11.3*  HGB 15.9  HCT 46.3  PLT 206    Chemistries  Recent Labs  Lab 08/05/18 2127  08/07/18 0405  NA 139   < > 140  K 4.4   < > 4.6  CL 101   < > 106  CO2 21*   < > 29  GLUCOSE 174*   < > 93  BUN 17   < > 17  CREATININE 2.52*   < > 0.99  CALCIUM 11.8*   < > 9.4  MG 2.3  --   --   AST 43*  --   --   ALT 33  --   --   ALKPHOS 86  --   --   BILITOT 2.4*  --   --    < > = values in this interval not displayed.    Cardiac Enzymes No results for input(s): TROPONINI in the last 168 hours.  Microbiology Results  Results for orders placed or performed during the hospital encounter of 07/03/16  Blood culture (routine x 2)     Status:  None   Collection Time: 07/03/16  1:46 PM  Result Value Ref Range Status   Specimen Description BLOOD LEFT ASSIST CONTROL  Final   Special Requests BOTTLES DRAWN AEROBIC AND ANAEROBIC 8CC  Final   Culture NO GROWTH 5 DAYS  Final   Report Status 07/08/2016 FINAL  Final  Blood culture (routine x 2)     Status: None   Collection Time: 07/03/16  1:46 PM  Result Value Ref Range Status   Specimen Description BLOOD RIGHT ASSIST CONTROL  Final   Special Requests BOTTLES DRAWN AEROBIC AND ANAEROBIC 5 CC  Final   Culture NO GROWTH 5 DAYS  Final   Report Status 07/08/2016 FINAL  Final  MRSA PCR Screening     Status: None   Collection Time: 07/03/16  8:23 PM  Result Value Ref Range Status   MRSA by PCR NEGATIVE NEGATIVE Final    Comment:        The GeneXpert MRSA Assay (FDA approved for  NASAL specimens only), is one component of a comprehensive MRSA colonization surveillance program. It is not intended to diagnose MRSA infection nor to guide or monitor treatment for MRSA infections.     RADIOLOGY:  [place] Renal  Result Date: 08/06/2018 CLINICAL DATA:  Urinary retention EXAM: RENAL / URINARY TRACT ULTRASOUND COMPLETE COMPARISON:  07/03/2016 abdominal CT FINDINGS: Right Kidney: Length: 11.5 cm. Echogenicity within normal limits. No mass or hydronephrosis visualized. Left Kidney: Length: 11.9 cm. Echogenicity within normal limits. No mass or hydronephrosis visualized. Bladder: Appears normal for degree of bladder distention. IMPRESSION: Normal renal ultrasound. No urinary retention currently, pre and postvoid bladder volume is 102 and 8 cc. Electronically Signed   By: Marnee SpringJonathon  Watts M.D.   On: 08/06/2018 15:34    EKG:   Orders placed or performed during the hospital encounter of 07/03/16  . ED EKG  . ED EKG  . EKG      Management plans discussed with the patient, family and they are in agreement.  CODE STATUS:     Code Status Orders  (From admission, onward)         Start     Ordered   08/06/18 0036  Full code  Continuous     08/06/18 0035        Code Status History    Date Active Date Inactive Code Status Order ID Comments User Context   07/03/2016 2008 07/05/2016 1205 Full Code [id number]  Enedina FinnerPatel, Sona, MD Inpatient      TOTAL TIME TAKING CARE OF THIS PATIENT: 41 minutes.   Note: This dictation was prepared with Dragon dictation along with smaller phrase technology. Any transcriptional errors that result from this process are unintentional.   @[facility] @  on 08/07/2018 at 12:01 PM  Between 7am to 6pm - Pager - [phone]  After 6pm go to [email or web address] - password [email or web address] [facility]  [facility]  Office  [phone]  CC: Primary care physician; Judee ClaraSmith, Caroline E, RN

## 2018-08-07 NOTE — Discharge Instructions (Signed)
Acute Kidney Injury, Adult Acute kidney injury is a sudden worsening of kidney function. The kidneys are organs that have several jobs. They filter the blood to remove waste products and extra fluid. They also maintain a healthy balance of minerals and hormones in the body, which helps control blood pressure and keep bones strong. With this condition, your kidneys do not do their jobs as well as they should. This condition ranges from mild to severe. Over time it may develop into long-lasting (chronic) kidney disease. Early detection and treatment may prevent acute kidney injury from developing into a chronic condition. What are the causes? Common causes of this condition include:  A problem with blood flow to the kidneys. This may be caused by: ? Low blood pressure (hypotension) or shock. ? Blood loss. ? Heart and blood vessel (cardiovascular) disease. ? Severe burns. ? Liver disease.  Direct damage to the kidneys. This may be caused by: ? Certain medicines. ? A kidney infection. ? Poisoning. ? Being around or in contact with toxic substances. ? A surgical wound. ? A hard, direct hit to the kidney area.  A sudden blockage of urine flow. This may be caused by: ? Cancer. ? Kidney stones. ? An enlarged prostate in males.  What are the signs or symptoms? Symptoms of this condition may not be obvious until the condition becomes severe. Symptoms of this condition can include:  Tiredness (lethargy), or difficulty staying awake.  Nausea or vomiting.  Swelling (edema) of the face, legs, ankles, or feet.  Problems with urination, such as: ? Abdominal pain, or pain along the side of your stomach (flank). ? Decreased urine production. ? Decrease in the force of urine flow.  Muscle twitches and cramps, especially in the legs.  Confusion or trouble concentrating.  Loss of appetite.  Fever.  How is this diagnosed? This condition may be diagnosed with tests, including:  Blood  tests.  Urine tests.  Imaging tests.  A test in which a sample of tissue is removed from the kidneys to be examined under a microscope (kidney biopsy).  How is this treated? Treatment for this condition depends on the cause and how severe the condition is. In mild cases, treatment may not be needed. The kidneys may heal on their own. In more severe cases, treatment will involve:  Treating the cause of the kidney injury. This may involve changing any medicines you are taking or adjusting your dosage.  Fluids. You may need specialized IV fluids to balance your body's needs.  Having a catheter placed to drain urine and prevent blockages.  Preventing problems from occurring. This may mean avoiding certain medicines or procedures that can cause further injury to the kidneys.  In some cases treatment may also require:  A procedure to remove toxic wastes from the body (dialysis or continuous renal replacement therapy - CRRT).  Surgery. This may be done to repair a torn kidney, or to remove the blockage from the urinary system.  Follow these instructions at home: Medicines  Take over-the-counter and prescription medicines only as told by your health care provider.  Do not take any new medicines without your health care provider's approval. Many medicines can worsen your kidney damage.  Do not take any vitamin and mineral supplements without your health care provider's approval. Many nutritional supplements can worsen your kidney damage. Lifestyle  If your health care provider prescribed changes to your diet, follow them. You may need to decrease the amount of protein you eat.  Achieve and maintain a healthy weight. If you need help with this, ask your health care provider.  Start or continue an exercise plan. Try to exercise at least 30 minutes a day, 5 days a week.  Do not use any tobacco products, such as cigarettes, chewing tobacco, and e-cigarettes. If you need help quitting, ask  your health care provider. General instructions  Keep track of your blood pressure. Report changes in your blood pressure as told by your health care provider.  Stay up to date with immunizations. Ask your health care provider which immunizations you need.  Keep all follow-up visits as told by your health care provider. This is important. Where to find more information:  American Association of Kidney Patients: ResidentialShow.iswww.aakp.org  [employer]: www.kidney.org  American Kidney Fund: FightingMatch.com.eewww.akfinc.org  [employer] [facility]: ? www.lifeoptions.org ? www.kidneyschool.org Contact a health care provider if:  Your symptoms get worse.  You develop new symptoms. Get help right away if:  You develop symptoms of worsening kidney disease, which include: ? Headaches. ? Abnormally dark or light skin. ? Easy bruising. ? Frequent hiccups. ? Chest pain. ? Shortness of breath. ? End of menstruation in women. ? Seizures. ? Confusion or altered mental status. ? Abdominal or back pain. ? Itchiness.  You have a fever.  Your body is producing less urine.  You have pain or bleeding when you urinate. Summary  Acute kidney injury is a sudden worsening of kidney function.  Acute kidney injury can be caused by problems with blood flow to the kidneys, direct damage to the kidneys, and sudden blockage of urine flow.  Symptoms of this condition may not be obvious until it becomes severe. Symptoms may include edema, lethargy, confusion, nausea or vomiting, and problems passing urine.  This condition can usually be diagnosed with blood tests, urine tests, and imaging tests. Sometimes a kidney biopsy is done to diagnose this condition.  Treatment for this condition often involves treating the underlying cause. It is treated with fluids, medicines, dialysis, diet changes, or surgery. This information is not intended to replace advice given to you by your health care provider.  Make sure you discuss any questions you have with your health care provider. Document Released: 06/23/2011 Document Revised: 04/09/2017 Document Reviewed: 11/28/2016 Elsevier Interactive Patient Education  2018 [employer]   Dehydration, Adult Dehydration is when there is not enough fluid or water in your body. This happens when you lose more fluids than you take in. Dehydration can range from mild to very bad. It should be treated right away to keep it from getting very bad. Symptoms of mild dehydration may include:  Thirst.  Dry lips.  Slightly dry mouth.  Dry, warm skin.  Dizziness. Symptoms of moderate dehydration may include:  Very dry mouth.  Muscle cramps.  Dark pee (urine). Pee may be the color of tea.  Your body making less pee.  Your eyes making fewer tears.  Heartbeat that is uneven or faster than normal (palpitations).  Headache.  Light-headedness, especially when you stand up from sitting.  Fainting (syncope). Symptoms of very bad dehydration may include:  Changes in skin, such as: ? Cold and clammy skin. ? Blotchy (mottled) or pale skin. ? Skin that does not quickly return to normal after being lightly pinched and let go (poor skin turgor).  Changes in body fluids, such as: ? Feeling very thirsty. ? Your eyes making fewer tears. ? Not sweating when body temperature is high, such as in hot  weather. ? Your body making very little pee.  Changes in vital signs, such as: ? Weak pulse. ? Pulse that is more than 100 beats a minute when you are sitting still. ? Fast breathing. ? Low blood pressure.  Other changes, such as: ? Sunken eyes. ? Cold hands and feet. ? Confusion. ? Lack of energy (lethargy). ? Trouble waking up from sleep. ? Short-term weight loss. ? Unconsciousness. Follow these instructions at home:  If told by your doctor, drink an ORS: ? Make an ORS by using instructions on the package. ? Start by drinking small amounts, about   cup (120 mL) every 5-10 minutes. ? Slowly drink more until you have had the amount that your doctor said to have.  Drink enough clear fluid to keep your pee clear or pale yellow. If you were told to drink an ORS, finish the ORS first, then start slowly drinking clear fluids. Drink fluids such as: ? Water. Do not drink only water by itself. Doing that can make the salt (sodium) level in your body get too low (hyponatremia). ? Ice chips. ? Fruit juice that you have added water to (diluted). ? Low-calorie sports drinks.  Avoid: ? Alcohol. ? Drinks that have a lot of sugar. These include high-calorie sports drinks, fruit juice that does not have water added, and soda. ? Caffeine. ? Foods that are greasy or have a lot of fat or sugar.  Take over-the-counter and prescription medicines only as told by your doctor.  Do not take salt tablets. Doing that can make the salt level in your body get too high (hypernatremia).  Eat foods that have minerals (electrolytes). Examples include bananas, oranges, potatoes, tomatoes, and spinach.  Keep all follow-up visits as told by your doctor. This is important. Contact a doctor if:  You have belly (abdominal) pain that: ? Gets worse. ? Stays in one area (localizes).  You have a rash.  You have a stiff neck.  You get angry or annoyed more easily than normal (irritability).  You are more sleepy than normal.  You have a harder time waking up than normal.  You feel: ? Weak. ? Dizzy. ? Very thirsty.  You have peed (urinated) only a small amount of very dark pee during 6-8 hours. Get help right away if:  You have symptoms of very bad dehydration.  You cannot drink fluids without throwing up (vomiting).  Your symptoms get worse with treatment.  You have a fever.  You have a very bad headache.  You are throwing up or having watery poop (diarrhea) and it: ? Gets worse. ? Does not go away.  You have blood or something green (bile) in  your throw-up.  You have blood in your poop (stool). This may cause poop to look black and tarry.  You have not peed in 6-8 hours.  You pass out (faint).  Your heart rate when you are sitting still is more than 100 beats a minute.  You have trouble breathing. This information is not intended to replace advice given to you by your health care provider. Make sure you discuss any questions you have with your health care provider. Document Released: 10/04/2009 Document Revised: 06/27/2016 Document Reviewed: 02/01/2016 Elsevier Interactive Patient Education  2018 [employer] Follow-up with primary care physician in 5 to 7 days or sooner as needed

## 2018-08-07 NOTE — Plan of Care (Signed)
Patient is voiding well without nausea and vomiting.  Lab values are improving.  Matthew Christian, Demarus Latterell N

## 2018-08-07 NOTE — Progress Notes (Signed)
Patient cleared for discharge by Dr Amado CoeGouru. Education complete. AVS printed. Discharge instructions given. All questions answered for patient clarification.  Prescriptions given, pharmacy verified.  IV removed.  Discharged to home via POV

## 2018-08-10 ENCOUNTER — Telehealth: Payer: Self-pay

## 2018-08-10 NOTE — Telephone Encounter (Signed)
Flagged on EMMI report for not having a follow up appointment.  Per report,  He answered doesn't apply.  After reviewing chart, the discharge summary mentions following up with PCP in 5-7 days, however it was not listed on AVS.  First attempt to reach patient made 08/10/18 at 1:13pm, however unable to reach.  Left message encouraging callback.

## 2018-08-12 NOTE — Telephone Encounter (Signed)
Second attempt to reach made 08/11/18 at 11:33am, however unable to reach.  Left another message encouraging callback.

## 2020-08-15 ENCOUNTER — Other Ambulatory Visit: Payer: Self-pay

## 2020-08-15 ENCOUNTER — Emergency Department
Admission: EM | Admit: 2020-08-15 | Discharge: 2020-08-15 | Disposition: A | Discharge status: Home / Self Care | Payer: Medicaid Other | Attending: Emergency Medicine | Admitting: Emergency Medicine

## 2020-08-15 ENCOUNTER — Encounter: Payer: Self-pay | Admitting: Emergency Medicine

## 2020-08-15 DIAGNOSIS — Z5321 Procedure and treatment not carried out due to patient leaving prior to being seen by health care provider: Secondary | ICD-10-CM | POA: Insufficient documentation

## 2020-08-15 DIAGNOSIS — R112 Nausea with vomiting, unspecified: Secondary | ICD-10-CM | POA: Insufficient documentation

## 2020-08-15 DIAGNOSIS — R1084 Generalized abdominal pain: Secondary | ICD-10-CM | POA: Insufficient documentation

## 2020-08-15 LAB — CBC
HCT: 54.4 % — ABNORMAL HIGH (ref 39.0–52.0)
Hemoglobin: 19.1 g/dL — ABNORMAL HIGH (ref 13.0–17.0)
MCH: 30.7 pg (ref 26.0–34.0)
MCHC: 35.1 g/dL (ref 30.0–36.0)
MCV: 87.5 fL (ref 80.0–100.0)
Platelets: 294 10*3/uL (ref 150–400)
RBC: 6.22 MIL/uL — ABNORMAL HIGH (ref 4.22–5.81)
RDW: 13.8 % (ref 11.5–15.5)
WBC: 17.8 10*3/uL — ABNORMAL HIGH (ref 4.0–10.5)
nRBC: 0 % (ref 0.0–0.2)

## 2020-08-15 LAB — COMPREHENSIVE METABOLIC PANEL
ALT: 29 U/L (ref 0–44)
AST: 31 U/L (ref 15–41)
Albumin: 6.5 g/dL — ABNORMAL HIGH (ref 3.5–5.0)
Alkaline Phosphatase: 78 U/L (ref 38–126)
Anion gap: 16 — ABNORMAL HIGH (ref 5–15)
BUN: 30 mg/dL — ABNORMAL HIGH (ref 6–20)
CO2: 26 mmol/L (ref 22–32)
Calcium: 11.1 mg/dL — ABNORMAL HIGH (ref 8.9–10.3)
Chloride: 97 mmol/L — ABNORMAL LOW (ref 98–111)
Creatinine, Ser: 1.67 mg/dL — ABNORMAL HIGH (ref 0.61–1.24)
GFR calc Af Amer: >60 mL/min (ref >60)
GFR calc non Af Amer: 56 mL/min — ABNORMAL LOW (ref >60)
Glucose, Bld: 128 mg/dL — ABNORMAL HIGH (ref 70–99)
Potassium: 4.8 mmol/L (ref 3.5–5.1)
Sodium: 139 mmol/L (ref 135–145)
Total Bilirubin: 3 mg/dL — ABNORMAL HIGH (ref 0.3–1.2)
Total Protein: 9.7 g/dL — ABNORMAL HIGH (ref 6.5–8.1)

## 2020-08-15 LAB — CK: Total CK: 381 U/L (ref 49–397)

## 2020-08-15 LAB — LIPASE, BLOOD: Lipase: 21 U/L (ref 11–51)

## 2020-08-15 NOTE — ED Triage Notes (Signed)
Pt presents via POV with c/o nausea and vomiting x24 hours. Pt endorses generalized abdominal pain as well. Pt states vomit has coffee ground color. Pt states last time this happened it was related to a "heat stroke". Pt currently alert and oriented x4.

## 2020-08-16 ENCOUNTER — Emergency Department
Admission: EM | Admit: 2020-08-16 | Discharge: 2020-08-16 | Disposition: A | Discharge status: Home / Self Care | Payer: Self-pay | Attending: Emergency Medicine | Admitting: Emergency Medicine

## 2020-08-16 ENCOUNTER — Ambulatory Visit
Admission: EM | Admit: 2020-08-16 | Discharge: 2020-08-16 | Disposition: A | Discharge status: Home / Self Care | Payer: Medicaid Other

## 2020-08-16 ENCOUNTER — Emergency Department: Payer: Self-pay

## 2020-08-16 ENCOUNTER — Other Ambulatory Visit: Payer: Self-pay

## 2020-08-16 ENCOUNTER — Encounter: Payer: Self-pay | Admitting: Intensive Care

## 2020-08-16 DIAGNOSIS — F17210 Nicotine dependence, cigarettes, uncomplicated: Secondary | ICD-10-CM | POA: Insufficient documentation

## 2020-08-16 DIAGNOSIS — R112 Nausea with vomiting, unspecified: Secondary | ICD-10-CM | POA: Insufficient documentation

## 2020-08-16 DIAGNOSIS — R197 Diarrhea, unspecified: Secondary | ICD-10-CM | POA: Insufficient documentation

## 2020-08-16 DIAGNOSIS — R1084 Generalized abdominal pain: Secondary | ICD-10-CM | POA: Insufficient documentation

## 2020-08-16 HISTORY — DX: Gastro-esophageal reflux disease without esophagitis: K21.9

## 2020-08-16 LAB — COMPREHENSIVE METABOLIC PANEL
ALT: 31 U/L (ref 0–44)
AST: 32 U/L (ref 15–41)
Albumin: 6.2 g/dL — ABNORMAL HIGH (ref 3.5–5.0)
Alkaline Phosphatase: 79 U/L (ref 38–126)
Anion gap: 16 — ABNORMAL HIGH (ref 5–15)
BUN: 49 mg/dL — ABNORMAL HIGH (ref 6–20)
CO2: 27 mmol/L (ref 22–32)
Calcium: 10.9 mg/dL — ABNORMAL HIGH (ref 8.9–10.3)
Chloride: 93 mmol/L — ABNORMAL LOW (ref 98–111)
Creatinine, Ser: 1.52 mg/dL — ABNORMAL HIGH (ref 0.61–1.24)
GFR calc Af Amer: >60 mL/min (ref >60)
GFR calc non Af Amer: >60 mL/min (ref >60)
Glucose, Bld: 169 mg/dL — ABNORMAL HIGH (ref 70–99)
Potassium: 4.1 mmol/L (ref 3.5–5.1)
Sodium: 136 mmol/L (ref 135–145)
Total Bilirubin: 3 mg/dL — ABNORMAL HIGH (ref 0.3–1.2)
Total Protein: 9.6 g/dL — ABNORMAL HIGH (ref 6.5–8.1)

## 2020-08-16 LAB — CBC WITH DIFFERENTIAL/PLATELET
Abs Immature Granulocytes: 0.12 10*3/uL — ABNORMAL HIGH (ref 0.00–0.07)
Abs Immature Granulocytes: 0.05 10*3/uL (ref 0.00–0.07)
Basophils Absolute: 0 10*3/uL (ref 0.0–0.1)
Basophils Absolute: 0 10*3/uL (ref 0.0–0.1)
Basophils Relative: 0 %
Basophils Relative: 0 %
Eosinophils Absolute: 0 10*3/uL (ref 0.0–0.5)
Eosinophils Absolute: 0 10*3/uL (ref 0.0–0.5)
Eosinophils Relative: 0 %
Eosinophils Relative: 0 %
HCT: 55.3 % — ABNORMAL HIGH (ref 39.0–52.0)
HCT: 46.8 % (ref 39.0–52.0)
Hemoglobin: 19.4 g/dL — ABNORMAL HIGH (ref 13.0–17.0)
Hemoglobin: 16.4 g/dL (ref 13.0–17.0)
Immature Granulocytes: 1 %
Immature Granulocytes: 0 %
Lymphocytes Relative: 8 %
Lymphocytes Relative: 20 %
Lymphs Abs: 1.6 10*3/uL (ref 0.7–4.0)
Lymphs Abs: 3.2 10*3/uL (ref 0.7–4.0)
MCH: 30.5 pg (ref 26.0–34.0)
MCH: 30.4 pg (ref 26.0–34.0)
MCHC: 35.1 g/dL (ref 30.0–36.0)
MCHC: 35 g/dL (ref 30.0–36.0)
MCV: 86.8 fL (ref 80.0–100.0)
MCV: 86.8 fL (ref 80.0–100.0)
Monocytes Absolute: 1.1 10*3/uL — ABNORMAL HIGH (ref 0.1–1.0)
Monocytes Absolute: 1.3 10*3/uL — ABNORMAL HIGH (ref 0.1–1.0)
Monocytes Relative: 5 %
Monocytes Relative: 8 %
Neutro Abs: 17.9 10*3/uL — ABNORMAL HIGH (ref 1.7–7.7)
Neutro Abs: 11.6 10*3/uL — ABNORMAL HIGH (ref 1.7–7.7)
Neutrophils Relative %: 86 %
Neutrophils Relative %: 72 %
Platelets: 329 10*3/uL (ref 150–400)
Platelets: 228 10*3/uL (ref 150–400)
RBC: 6.37 MIL/uL — ABNORMAL HIGH (ref 4.22–5.81)
RBC: 5.39 MIL/uL (ref 4.22–5.81)
RDW: 13.6 % (ref 11.5–15.5)
RDW: 13.4 % (ref 11.5–15.5)
WBC: 20.7 10*3/uL — ABNORMAL HIGH (ref 4.0–10.5)
WBC: 16.1 10*3/uL — ABNORMAL HIGH (ref 4.0–10.5)
nRBC: 0 % (ref 0.0–0.2)
nRBC: 0 % (ref 0.0–0.2)

## 2020-08-16 LAB — URINALYSIS, COMPLETE (UACMP) WITH MICROSCOPIC
Bacteria, UA: NONE SEEN
Bilirubin Urine: NEGATIVE
Glucose, UA: NEGATIVE mg/dL
Hgb urine dipstick: NEGATIVE
Ketones, ur: 5 mg/dL — AB
Leukocytes,Ua: NEGATIVE
Nitrite: NEGATIVE
Protein, ur: 30 mg/dL — AB
Specific Gravity, Urine: >1.046 — ABNORMAL HIGH (ref 1.005–1.030)
Squamous Epithelial / LPF: NONE SEEN (ref 0–5)
pH: 6 (ref 5.0–8.0)

## 2020-08-16 LAB — CBC
HCT: 55.3 % — ABNORMAL HIGH (ref 39.0–52.0)
Hemoglobin: 19.4 g/dL — ABNORMAL HIGH (ref 13.0–17.0)
MCH: 30.3 pg (ref 26.0–34.0)
MCHC: 35.1 g/dL (ref 30.0–36.0)
MCV: 86.4 fL (ref 80.0–100.0)
Platelets: 299 10*3/uL (ref 150–400)
RBC: 6.4 MIL/uL — ABNORMAL HIGH (ref 4.22–5.81)
RDW: 13.4 % (ref 11.5–15.5)
WBC: 20.9 10*3/uL — ABNORMAL HIGH (ref 4.0–10.5)
nRBC: 0 % (ref 0.0–0.2)

## 2020-08-16 LAB — BASIC METABOLIC PANEL
Anion gap: 12 (ref 5–15)
BUN: 37 mg/dL — ABNORMAL HIGH (ref 6–20)
CO2: 27 mmol/L (ref 22–32)
Calcium: 9 mg/dL (ref 8.9–10.3)
Chloride: 99 mmol/L (ref 98–111)
Creatinine, Ser: 1.13 mg/dL (ref 0.61–1.24)
GFR calc Af Amer: >60 mL/min (ref >60)
GFR calc non Af Amer: >60 mL/min (ref >60)
Glucose, Bld: 90 mg/dL (ref 70–99)
Potassium: 3.9 mmol/L (ref 3.5–5.1)
Sodium: 138 mmol/L (ref 135–145)

## 2020-08-16 LAB — LIPASE, BLOOD: Lipase: 20 U/L (ref 11–51)

## 2020-08-16 MED ORDER — IOHEXOL 9 MG/ML PO SOLN
500.0000 mL | ORAL | Status: AC
  Administered 2020-08-16: 500 mL via ORAL

## 2020-08-16 MED ORDER — ONDANSETRON 4 MG PO TBDP: 4.0000 mg | ORAL_TABLET | Freq: Three times a day (TID) | ORAL | 0 refills | Status: DC | PRN

## 2020-08-16 MED ORDER — IOHEXOL 300 MG/ML  SOLN
100.0000 mL | Freq: Once | INTRAMUSCULAR | Status: AC | PRN
  Administered 2020-08-16: 100 mL via INTRAVENOUS

## 2020-08-16 MED ORDER — ONDANSETRON 4 MG PO TBDP
4.0000 mg | ORAL_TABLET | Freq: Once | ORAL | Status: AC | PRN
  Administered 2020-08-16: 4 mg via ORAL
  Filled 2020-08-16: qty 1

## 2020-08-16 MED ORDER — SODIUM CHLORIDE 0.9 % IV BOLUS
1000.0000 mL | Freq: Once | INTRAVENOUS | Status: AC
  Administered 2020-08-16: 1000 mL via INTRAVENOUS

## 2020-08-16 MED ORDER — ONDANSETRON HCL 4 MG/2ML IJ SOLN
4.0000 mg | Freq: Once | INTRAMUSCULAR | Status: AC
  Administered 2020-08-16: 4 mg via INTRAVENOUS
  Filled 2020-08-16: qty 2

## 2020-08-16 NOTE — ED Notes (Signed)
This RN called CT to inform them pt not tolerating oral contrast. MD made aware and advises to go ahead and scan pt

## 2020-08-16 NOTE — ED Notes (Signed)
Pt c/o nausea, EDP aware. New orders placed.

## 2020-08-16 NOTE — Discharge Instructions (Signed)
Please use the Zofran 1 melt on your tongue wafer 3 times a day as needed for nausea or vomiting.  Please return if you cannot keep anything down or if you have frequent diarrhea or get bad belly pain or fever.  Please follow-up with your regular doctor in the next couple days for recheck.  If you are completely well you do not have to return here but if you cannot follow-up with your doctor in the next few days and you are not completely well please return here for recheck.

## 2020-08-16 NOTE — ED Triage Notes (Signed)
Patient presents with abdominal pain with emesis X2 days. Reports diarrhea X1 today. C/o blood in emesis. Patient has bottle of green tea he has been drinking in lobby. Emesis present in emesis bag with no blood seen.

## 2020-08-16 NOTE — ED Notes (Signed)
Ct notified pt has finished po contrast.

## 2020-08-16 NOTE — ED Notes (Signed)
Pt transported to CT ?

## 2020-08-16 NOTE — ED Provider Notes (Signed)
-----------------------------------------   10:17 PM on 08/16/2020 -----------------------------------------  Patient care assumed from Dr. Juliette Alcide.  Patient's CBC is improved, downtrending white blood cell count, normalized H&H much less hemoconcentrated appearing.  Patient states he is feeling much better, able to tolerate p.o.  We will discharge the patient home with nausea medication.  I discussed return precautions.  Patient agreeable to plan of care.   Minna Antis, MD 08/16/20 2217

## 2020-08-16 NOTE — ED Provider Notes (Signed)
[facility] Emergency Department Provider Note   ____________________________________________   First MD Initiated Contact with Patient 08/16/20 1601     (approximate)  I have reviewed the triage vital signs and the nursing notes.   HISTORY  Chief Complaint Abdominal Pain and Emesis   HPI Matthew Christian is a 24 y.o. male with vomiting for 2 days and one episode of diarrhea which was today.  Similar vomit has had a coffee-ground color.  In the lobby patient was drinking green tea and vomiting afterwards each time.  Patient told the nurse that he had something like this from heat stroke previously.  He is tachycardic but is not running a fever.  Reports some moderate pain and tenderness in his low abdomen.         Past Medical History:  Diagnosis Date  . Abdominal pain, recurrent   . Acid reflux   . IBS (irritable bowel syndrome)   . Pica in adults     Patient Active Problem List   Diagnosis Date Noted  . Nausea & vomiting 08/05/2018  . Acute renal failure (ARF) (HCC) 07/03/2016  . Diarrhea 04/08/2012  . Halitosis 04/08/2012  . Nausea 04/08/2012  . Pica in adults 04/08/2012  . Epigastric abdominal pain     History reviewed. No pertinent surgical history.  Prior to Admission medications   Medication Sig Start Date End Date Taking? Authorizing Provider  nicotine (NICODERM CQ - DOSED IN MG/24 HOURS) 21 mg/24hr patch Place 1 patch (21 mg total) onto the skin daily. 08/08/18   Ramonita Lab, MD    Allergies Patient has no known allergies.  Family History  Adopted: Yes  Problem Relation Age of Onset  . Irritable bowel syndrome Mother     Social History Social History   Tobacco Use  . Smoking status: Current Every Day Smoker    Types: Cigarettes  . Smokeless tobacco: Current User    Types: Snuff, Chew  Substance Use Topics  . Alcohol use: Yes    Comment: occ  . Drug use: No    Review of Systems  Constitutional: No  fever/chills Eyes: No visual changes. ENT: No sore throat. Cardiovascular: Denies chest pain. Respiratory: Denies shortness of breath. Gastrointestinal: abdominal pain.  nausea, vomiting.   diarrhea.  No constipation. Genitourinary: Negative for dysuria. Musculoskeletal: Negative for back pain. Skin: Negative for rash. Neurological: Negative for headaches, focal weakness ____________________________________________   PHYSICAL EXAM:  VITAL SIGNS: ED Triage Vitals [08/16/20 1151]  Enc Vitals Group     BP 129/90     Pulse Rate (!) 115     Resp 16     Temp 98.2 F (36.8 C)     Temp Source Oral     SpO2 97 %     Weight 150 lb (68 kg)     Height 5' 11.5" (1.816 m)     Head Circumference      Peak Flow      Pain Score 9     Pain Loc      Pain Edu?      Excl. in GC?     Constitutional: Alert and oriented. Well appearing and in no acute distress. Eyes: Conjunctivae are normal. Head: Atraumatic. Nose: No congestion/rhinnorhea. Mouth/Throat: Mucous membranes are moist.  Oropharynx non-erythematous. Neck: No stridor.   Cardiovascular: Normal rate, regular rhythm. Grossly normal heart sounds.  Good peripheral circulation. Respiratory: Normal respiratory effort.  No retractions. Lungs CTAB. Gastrointestinal: Soft tender to palpation between the  suprapubic area and umbilicus not tender elsewhere.  Thank you no distention. No abdominal bruits.  Musculoskeletal: No lower extremity tenderness nor edema.  Neurologic:  Normal speech and language. No gross focal neurologic deficits are appreciated. Skin:  Skin is warm, dry and intact. No rash noted.   ____________________________________________   LABS (all labs ordered are listed, but only abnormal results are displayed)  Labs Reviewed  COMPREHENSIVE METABOLIC PANEL - Abnormal; Notable for the following components:      Result Value   Chloride 93 (*)    Glucose, Bld 169 (*)    BUN 49 (*)    Creatinine, Ser 1.52 (*)    Calcium  10.9 (*)    Total Protein 9.6 (*)    Albumin 6.2 (*)    Total Bilirubin 3.0 (*)    Anion gap 16 (*)    All other components within normal limits  CBC - Abnormal; Notable for the following components:   WBC 20.9 (*)    RBC 6.40 (*)    Hemoglobin 19.4 (*)    HCT 55.3 (*)    All other components within normal limits  CBC WITH DIFFERENTIAL/PLATELET - Abnormal; Notable for the following components:   WBC 20.7 (*)    RBC 6.37 (*)    Hemoglobin 19.4 (*)    HCT 55.3 (*)    Neutro Abs 17.9 (*)    Monocytes Absolute 1.1 (*)    Abs Immature Granulocytes 0.12 (*)    All other components within normal limits  LIPASE, BLOOD  URINALYSIS, COMPLETE (UACMP) WITH MICROSCOPIC  BASIC METABOLIC PANEL  CBC WITH DIFFERENTIAL/PLATELET   ____________________________________________  EKG   ____________________________________________  RADIOLOGY  ED MD interpretation:    Official radiology report(s): CT ABDOMEN PELVIS W CONTRAST  Result Date: 08/16/2020 CLINICAL DATA:  Abdominal pain and vomiting for 2 days EXAM: CT ABDOMEN AND PELVIS WITH CONTRAST TECHNIQUE: Multidetector CT imaging of the abdomen and pelvis was performed using the standard protocol following bolus administration of intravenous contrast. CONTRAST:  OMNIPAQUE IOHEXOL 300 MG/ML  SOLN COMPARISON:  07/03/2016 FINDINGS: Lower chest: No acute abnormality. Hepatobiliary: No focal liver abnormality is seen. No gallstones, gallbladder wall thickening, or biliary dilatation. Pancreas: Unremarkable. No pancreatic ductal dilatation or surrounding inflammatory changes. Spleen: Normal in size without focal abnormality. Adrenals/Urinary Tract: Adrenal glands are within normal limits. Kidneys demonstrate a normal enhancement pattern. No renal calculi or obstructive changes are seen. Bladder is well distended. Stomach/Bowel: Scattered diverticular change of the colon is noted without evidence of diverticulitis. No obstructive changes are seen. The  appendix is within normal limits. Stomach and small bowel appear within normal limits. Vascular/Lymphatic: No significant vascular findings are present. No enlarged abdominal or pelvic lymph nodes. Reproductive: Prostate is unremarkable. Other: No abdominal wall hernia or abnormality. No abdominopelvic ascites. Musculoskeletal: No acute or significant osseous findings. IMPRESSION: Diverticulosis without diverticulitis.  No acute abnormality noted. Electronically Signed   By: Alcide Clever M.D.   On: 08/16/2020 18:40    ____________________________________________   PROCEDURES  Procedure(s) performed (including Critical Care):  Procedures   ____________________________________________   INITIAL IMPRESSION / ASSESSMENT AND PLAN / ED COURSE  ----------------------------------------- 8:31 PM on 08/16/2020 -----------------------------------------  Patient is feeling better.  He has not vomited.  He still has some abdominal pressure.  I will let him try and drink some more sips of apple juice.  If he can tolerate this and his lab work improved somewhat we will plan on letting him go.  He can  follow-up with his regular doctor and return if worse.              ____________________________________________   FINAL CLINICAL IMPRESSION(S) / ED DIAGNOSES  Final diagnoses:  Nausea vomiting and diarrhea  Generalized abdominal pain     ED Discharge Orders    None       Note:  This document was prepared using Dragon voice recognition software and may include unintentional dictation errors.    Arnaldo Natal, MD 08/16/20 2033

## 2020-08-16 NOTE — ED Notes (Signed)
Patient encouraged in triage to quit drinking his green tea. Patient refuses and vomiting in triage everytime after drinking his drink.

## 2022-02-10 ENCOUNTER — Emergency Department: Payer: Medicaid Other

## 2022-02-10 ENCOUNTER — Other Ambulatory Visit: Payer: Self-pay

## 2022-02-10 ENCOUNTER — Emergency Department
Admission: EM | Admit: 2022-02-10 | Discharge: 2022-02-10 | Disposition: A | Discharge status: Home / Self Care | Payer: Medicaid Other | Attending: Emergency Medicine | Admitting: Emergency Medicine

## 2022-02-10 DIAGNOSIS — M25512 Pain in left shoulder: Secondary | ICD-10-CM

## 2022-02-10 DIAGNOSIS — R079 Chest pain, unspecified: Secondary | ICD-10-CM | POA: Insufficient documentation

## 2022-02-10 LAB — CBC WITH DIFFERENTIAL/PLATELET
Abs Immature Granulocytes: 0.02 10*3/uL (ref 0.00–0.07)
Basophils Absolute: 0.1 10*3/uL (ref 0.0–0.1)
Basophils Relative: 1 %
Eosinophils Absolute: 0.6 10*3/uL — ABNORMAL HIGH (ref 0.0–0.5)
Eosinophils Relative: 6 %
HCT: 43 % (ref 39.0–52.0)
Hemoglobin: 14.9 g/dL (ref 13.0–17.0)
Immature Granulocytes: 0 %
Lymphocytes Relative: 30 %
Lymphs Abs: 2.7 10*3/uL (ref 0.7–4.0)
MCH: 30.7 pg (ref 26.0–34.0)
MCHC: 34.7 g/dL (ref 30.0–36.0)
MCV: 88.7 fL (ref 80.0–100.0)
Monocytes Absolute: 0.5 10*3/uL (ref 0.1–1.0)
Monocytes Relative: 6 %
Neutro Abs: 5 10*3/uL (ref 1.7–7.7)
Neutrophils Relative %: 57 %
Platelets: 214 10*3/uL (ref 150–400)
RBC: 4.85 MIL/uL (ref 4.22–5.81)
RDW: 13.1 % (ref 11.5–15.5)
WBC: 8.8 10*3/uL (ref 4.0–10.5)
nRBC: 0 % (ref 0.0–0.2)

## 2022-02-10 LAB — COMPREHENSIVE METABOLIC PANEL
ALT: 18 U/L (ref 0–44)
AST: 22 U/L (ref 15–41)
Albumin: 4.2 g/dL (ref 3.5–5.0)
Alkaline Phosphatase: 52 U/L (ref 38–126)
Anion gap: 11 (ref 5–15)
BUN: 11 mg/dL (ref 6–20)
CO2: 24 mmol/L (ref 22–32)
Calcium: 9.2 mg/dL (ref 8.9–10.3)
Chloride: 106 mmol/L (ref 98–111)
Creatinine, Ser: 0.86 mg/dL (ref 0.61–1.24)
GFR, Estimated: >60 mL/min (ref >60)
Glucose, Bld: 88 mg/dL (ref 70–99)
Potassium: 4.1 mmol/L (ref 3.5–5.1)
Sodium: 141 mmol/L (ref 135–145)
Total Bilirubin: 1.9 mg/dL — ABNORMAL HIGH (ref 0.3–1.2)
Total Protein: 6.5 g/dL (ref 6.5–8.1)

## 2022-02-10 LAB — TROPONIN I (HIGH SENSITIVITY): Troponin I (High Sensitivity): <2 ng/L (ref <18)

## 2022-02-10 MED ORDER — MELOXICAM 15 MG PO TABS: 15.0000 mg | ORAL_TABLET | Freq: Every day | ORAL | 0 refills | Status: AC

## 2022-02-10 MED ORDER — METHOCARBAMOL 500 MG PO TABS: 500.0000 mg | ORAL_TABLET | Freq: Four times a day (QID) | ORAL | 0 refills | Status: AC

## 2022-02-10 MED ORDER — METHOCARBAMOL 500 MG PO TABS
1000.0000 mg | ORAL_TABLET | Freq: Once | ORAL | Status: AC
  Administered 2022-02-10: 1000 mg via ORAL
  Filled 2022-02-10: qty 2

## 2022-02-10 MED ORDER — MELOXICAM 7.5 MG PO TABS
15.0000 mg | ORAL_TABLET | Freq: Once | ORAL | Status: AC
  Administered 2022-02-10: 15 mg via ORAL
  Filled 2022-02-10: qty 2

## 2022-02-10 NOTE — ED Triage Notes (Signed)
Pt comes with c/o upper back pain since last week. Pt states pain is 8/10 with movement. Pt states no recent injuries.

## 2022-02-10 NOTE — ED Provider Notes (Signed)
[facility] Provider Note  Patient Contact: 3:38 PM (approximate)   History   Back Pain   HPI  Matthew Christian is a 26 y.o. male who presents the emergency department complaining of left shoulder and chest pain.  Patient states that he has had some intermittent left shoulder pain for the last week.  This morning he woke up with sensation of palpations and left-sided chest pain.  No radiation down the left arm.  There is been no injuries to the neck or chest.  No history of shoulder or neck problems.  Patient denies any GI symptoms.  No urinary symptoms.  He has been using IcyHot patches but no other medications for the symptoms.  When the symptoms developed into chest pain today he presents for evaluation.  No history of anxiety but patient does endorse finding out that his mother is going into renal failure and needs dialysis recently.     Physical Exam   Triage Vital Signs: ED Triage Vitals  Enc Vitals Group     BP 02/10/22 1258 128/76     Pulse Rate 02/10/22 1258 84     Resp 02/10/22 1258 18     Temp 02/10/22 1258 98 F (36.7 C)     Temp src --      SpO2 02/10/22 1258 96 %     Weight 02/10/22 1456 149 lb 14.6 oz (68 kg)     Height 02/10/22 1456 5\' 11"  (1.803 m)     Head Circumference --      Peak Flow --      Pain Score 02/10/22 1257 8     Pain Loc --      Pain Edu? --      Excl. in [place]? --     Most recent vital signs: Vitals:   02/10/22 1258  BP: 128/76  Pulse: 84  Resp: 18  Temp: 98 F (36.7 C)  SpO2: 96%     General: Alert and in no acute distress. Head: No acute traumatic findings  Neck: No stridor. No cervical spine tenderness to palpation.  Cardiovascular:  Good peripheral perfusion.  Heart sounds with no appreciable murmurs, rubs, gallops. Respiratory: Normal respiratory effort without tachypnea or retractions. Lungs CTAB. Good air entry to the bases with no decreased or absent breath sounds. Gastrointestinal: Bowel sounds 4  quadrants. Soft and nontender to palpation. No guarding or rigidity. No palpable masses. No distention. No CVA tenderness. Musculoskeletal: Full range of motion to all extremities.  Palpation along the left posterior shoulder reveals tenderness, what appears to be palpable spasms along the trapezius muscle and insertions over the rotator cuff are appreciated.  This area is tender and reproduces the patient's left shoulder pain.  Examination of the chest wall reveals no signs of trauma.  Equal chest rise and fall.  Tender along the left sternal border.  No crepitus, no subcutaneous emphysema.  Good underlying breath sounds bilaterally. Neurologic:  No gross focal neurologic deficits are appreciated.  Skin:   No rash noted Other:   ED Results / Procedures / Treatments   Labs (all labs ordered are listed, but only abnormal results are displayed) Labs Reviewed  COMPREHENSIVE METABOLIC PANEL - Abnormal; Notable for the following components:      Result Value   Total Bilirubin 1.9 (*)    All other components within normal limits  CBC WITH DIFFERENTIAL/PLATELET - Abnormal; Notable for the following components:   Eosinophils Absolute 0.6 (*)    All other  components within normal limits  TROPONIN I (HIGH SENSITIVITY)     EKG     RADIOLOGY  I personally viewed and evaluated these images as part of my medical decision making, as well as reviewing the written report by the radiologist.  ED Provider Interpretation: No acute cardiopulmonary abnormality or obvious musculoskeletal issues to the left shoulder.  DG Chest 2 View  Result Date: 02/10/2022 CLINICAL DATA:  Chest pain and left shoulder pain.  No recent injury EXAM: CHEST - 2 VIEW COMPARISON:  08/16/2020 FINDINGS: The heart size and mediastinal contours are within normal limits. Both lungs are clear. The visualized skeletal structures are unremarkable. IMPRESSION: No active cardiopulmonary disease. Electronically Signed   By: Davina Poke D.O.   On: 02/10/2022 16:30   DG Shoulder Left  Result Date: 02/10/2022 CLINICAL DATA:  Left shoulder pain EXAM: LEFT SHOULDER - 2+ VIEW COMPARISON:  None. FINDINGS: There is no evidence of fracture or dislocation. There is no evidence of arthropathy or other focal bone abnormality. Soft tissues are unremarkable. IMPRESSION: Negative. Electronically Signed   By: Davina Poke D.O.   On: 02/10/2022 16:30    PROCEDURES:  Critical Care performed: No  Procedures   MEDICATIONS ORDERED IN ED: Medications  meloxicam (MOBIC) tablet 15 mg (15 mg Oral Given 02/10/22 1759)  methocarbamol (ROBAXIN) tablet 1,000 mg (1,000 mg Oral Given 02/10/22 1759)     IMPRESSION / MDM / ASSESSMENT AND PLAN / ED COURSE  I reviewed the triage vital signs and the nursing notes.                              Differential diagnosis includes, but is not limited to, shoulder strain, shoulder pain, costochondritis, NSTEMI, STEMI, pneumonia, bronchitis   Patient's diagnosis is consistent with shoulder pain.  Patient presented to the emergency department waning of left shoulder pain.  Patient also had some intermittent chest pain earlier today.  This was reproducible both to the shoulder and chest wall.  Patient had labs, EKG, chest x-ray today which are all reassuring.  At this time with palpable findings reproducing the patient's pain I did feel that both the chest wall and the shoulder are musculoskeletal in nature.  Patient will have anti-inflammatory muscle relaxer for same.  Return precautions discussed with the patient..  Patient is given ED precautions to return to the ED for any worsening or new symptoms.        FINAL CLINICAL IMPRESSION(S) / ED DIAGNOSES   Final diagnoses:  Acute pain of left shoulder     Rx / DC Orders   ED Discharge Orders          Ordered    meloxicam (MOBIC) 15 MG tablet  Daily        02/10/22 1751    methocarbamol (ROBAXIN) 500 MG tablet  4 times daily         02/10/22 1751             Note:  This document was prepared using Dragon voice recognition software and may include unintentional dictation errors.   Darletta Moll, PA-C 02/10/22 1815    Arta Silence, MD 02/10/22 2342

## 2022-08-18 HISTORY — PX: TOOTH EXTRACTION: SUR596

## 2022-09-05 ENCOUNTER — Ambulatory Visit
Admission: EM | Admit: 2022-09-05 | Discharge: 2022-09-05 | Disposition: A | Discharge status: Home / Self Care | Payer: Medicaid Other | Attending: Physician Assistant | Admitting: Physician Assistant

## 2022-09-05 DIAGNOSIS — B349 Viral infection, unspecified: Secondary | ICD-10-CM

## 2022-09-05 DIAGNOSIS — R051 Acute cough: Secondary | ICD-10-CM

## 2022-09-05 DIAGNOSIS — K047 Periapical abscess without sinus: Secondary | ICD-10-CM

## 2022-09-05 DIAGNOSIS — B360 Pityriasis versicolor: Secondary | ICD-10-CM

## 2022-09-05 IMAGING — CR DG CHEST 2V
2 series · 2 of 2 positions shown · non-contrast
Comparison: 08/16/2020

CLINICAL DATA: Chest pain and left shoulder pain.  No recent injury

EXAM:
CHEST - 2 VIEW

[chest pa]
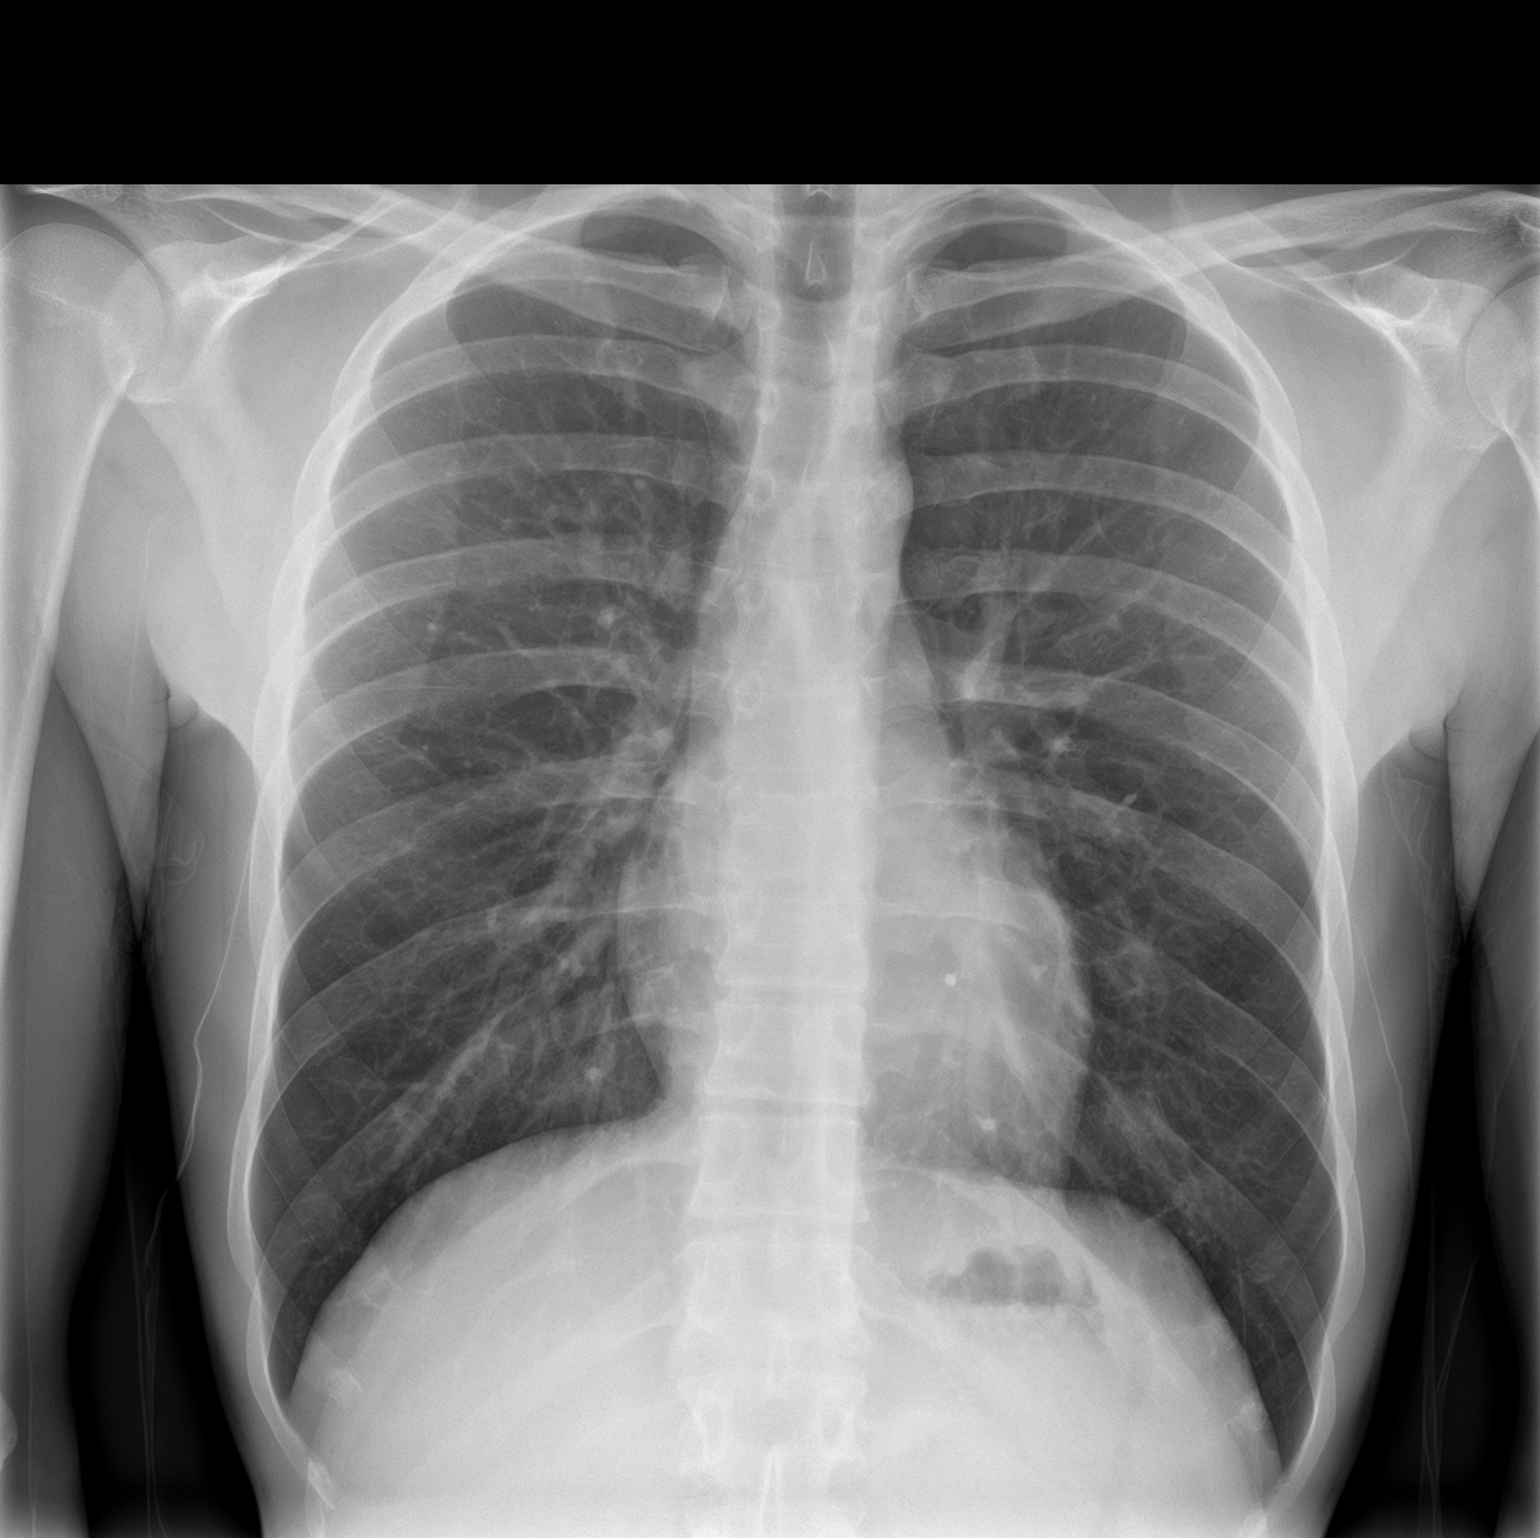

[chest lat]
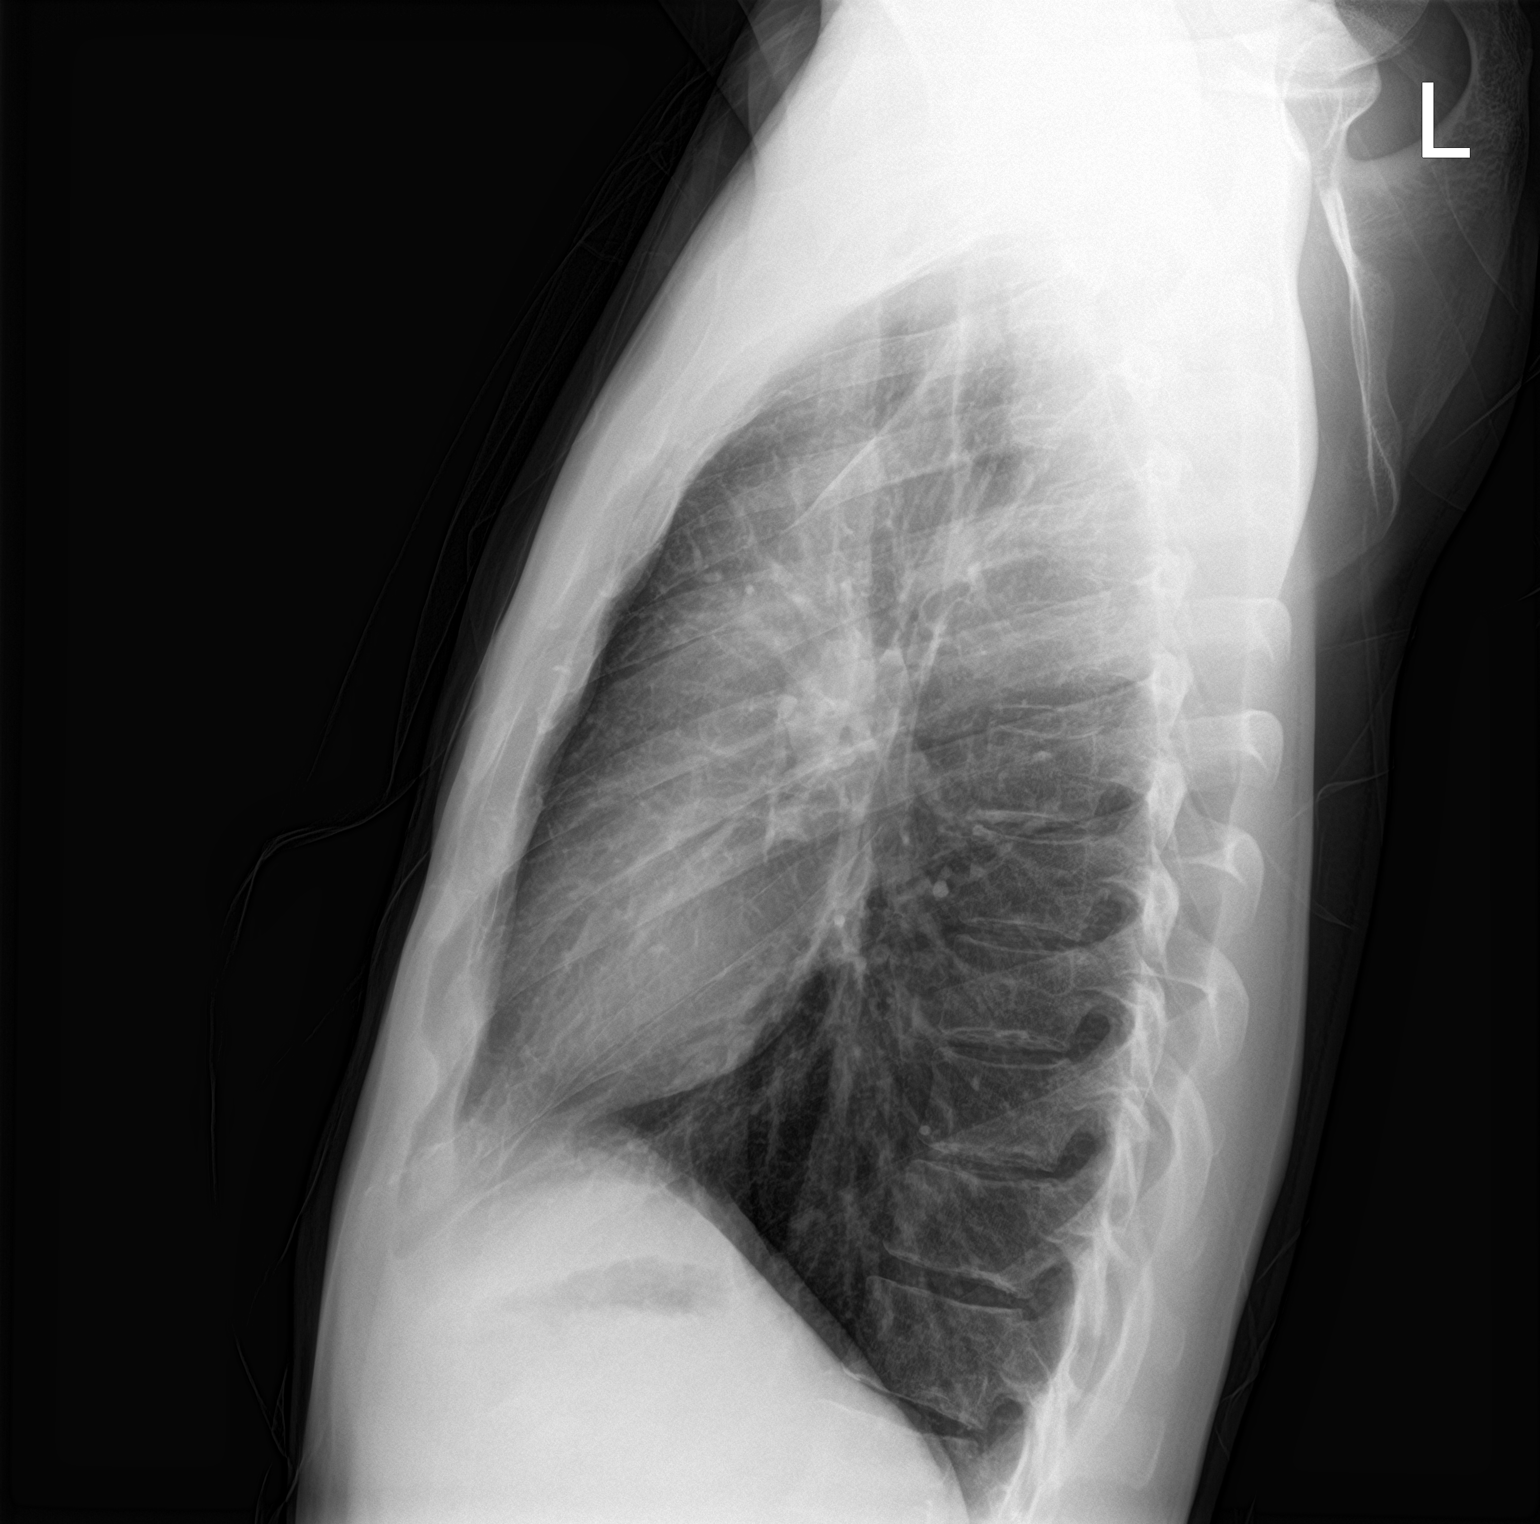

[2 of 2 positions shown; findings below may reference images not displayed]

FINDINGS: The heart size and mediastinal contours are within normal limits.
Both lungs are clear. The visualized skeletal structures are
unremarkable.
IMPRESSION: No active cardiopulmonary disease.

## 2022-09-05 MED ORDER — AMOXICILLIN-POT CLAVULANATE 875-125 MG PO TABS: 1.0000 | ORAL_TABLET | Freq: Two times a day (BID) | ORAL | 0 refills | Status: AC

## 2022-09-05 MED ORDER — KETOCONAZOLE 2 % EX CREA: 1.0000 | TOPICAL_CREAM | Freq: Every day | CUTANEOUS | 0 refills | Status: AC

## 2022-09-05 NOTE — Discharge Instructions (Addendum)
-  I have sent antibiotics to the pharmacy possibility of developing a dental abscess.  Follow-up with your dentist. - You have fungal skin infections I have sent a topical cream for that. - You also have a cold.  You will likely get better in 7 to 10 days.  Continue OTC meds.  URI/COLD SYMPTOMS: Your exam today is consistent with a viral illness. Antibiotics are not indicated at this time. Use medications as directed, including cough syrup, nasal saline, and decongestants. Your symptoms should improve over the next few days and resolve within 7-10 days. Increase rest and fluids. F/u if symptoms worsen or predominate such as sore throat, ear pain, productive cough, shortness of breath, or if you develop high fevers or worsening fatigue over the next several days.

## 2022-09-05 NOTE — ED Provider Notes (Signed)
[facility]    CSN: [id number] Arrival date & time: 09/05/22  1708      History   Chief Complaint Chief Complaint  Patient presents with   Oral Swelling   Cough    HPI Matthew Christian is a 27 y.o. male presenting with multiple complaints.  First he states that he is having dental pain of the left lower side for the past couple weeks.  He reports facial swelling on the left cheek.  States that he had a tooth pulled 2 weeks ago and was given antibiotics.  States he thinks the infection never cleared up and thinks he needs more antibiotics.  Reports he is not scheduled to see the dentist again until November but wants to be seen sooner.  He has been taking ibuprofen as needed.  Patient also reporting a cough and left chest tenderness for the past day.  Denies fever, congestion, sore throat or COVID exposure.  Denies any current sick contacts.  Has been taking over-the-counter cough medicine.  Denies any chest pain on breathing or shortness of breath.  Additionally, he reports rash of the left neck for the past 8 months.  He denies any itching.  He has applied calamine lotion without relief.   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 Authorizing Provider  amoxicillin-clavulanate (AUGMENTIN) 875-125 MG tablet Take 1 tablet by mouth every 12 (twelve) hours for 10 days. 09/05/22 09/15/22 Yes Shirlee Latch, PA-C  ketoconazole (NIZORAL) 2 % cream Apply 1 Application topically daily for 14 days. 09/05/22 09/19/22 Yes Shirlee Latch, PA-C  meloxicam (MOBIC) 15 MG tablet Take 1 tablet (15 mg total) by mouth daily. 02/10/22 02/10/23  Cuthriell, Delorise Royals, PA-C  methocarbamol (ROBAXIN) 500 MG tablet Take 1 tablet (500 mg total) by mouth 4 (four) times daily. 02/10/22   Cuthriell, Delorise Royals, PA-C    Family History Family History  Adopted: Yes  Problem Relation Age of Onset   Irritable bowel syndrome Mother     Social History Social History   Tobacco Use   Smoking status: Every Day    Types: Cigarettes   Smokeless tobacco: Former    Types: Snuff, Chew  Vaping Use   Vaping Use: Never used  Substance Use Topics   Alcohol use: Yes    Comment: occ   Drug use: No     Allergies   Patient has no known allergies.   Review of Systems Review of Systems  Constitutional:  Negative for fatigue and fever.  HENT:  Positive for dental problem and facial swelling. Negative for congestion, rhinorrhea, sinus pressure, sinus pain and sore throat.   Respiratory:  Positive for cough. Negative for shortness of breath.   Cardiovascular:  Positive for chest pain.  Gastrointestinal:  Negative for abdominal pain, diarrhea, nausea  and vomiting.  Musculoskeletal:  Negative for myalgias.  Skin:  Positive for rash.  Neurological:  Negative for weakness, light-headedness and headaches.  Hematological:  Negative for adenopathy.     Physical Exam Triage Vital Signs ED Triage Vitals  Enc Vitals Group     BP      Pulse      Resp      Temp      Temp src      SpO2      Weight      Height      Head Circumference      Peak Flow      Pain Score      Pain Loc      Pain Edu?      Excl. in [place]?    No data found.  Updated Vital Signs BP 123/89 (BP Location: Left Arm)   Pulse 80   Temp 98.8 F (37.1 C) (Oral)   Resp 18   Ht 5\' 9"  (1.753 m)   Wt 145 lb (65.8 kg)   SpO2 99%   BMI 21.41 kg/m    Physical Exam Vitals and nursing note reviewed.  Constitutional:      General: He is not in acute distress.    Appearance: Normal appearance. He is well-developed. He is not ill-appearing.  HENT:     Head: Normocephalic and atraumatic.     Nose: Nose normal.     Mouth/Throat:     Mouth: Mucous membranes are moist.     Dentition: Dental abscesses present.     Pharynx: Oropharynx is clear.  Eyes:     General: No scleral icterus.    Conjunctiva/sclera: Conjunctivae normal.  Cardiovascular:     Rate and Rhythm: Normal rate and regular rhythm.     Heart sounds: Normal heart sounds.  Pulmonary:     Effort: Pulmonary effort is normal. No respiratory distress.     Breath sounds: Normal breath sounds.  Chest:     Chest wall: Tenderness (TTP left chest wall) present.  Musculoskeletal:     Cervical back: Neck supple.  Skin:    General: Skin is warm and dry.     Capillary Refill: Capillary refill takes less than 2 seconds.     Findings: Rash (erythematous circular rash, confluent in some areas with central clearing--left neck) present.  Neurological:     General: No focal deficit present.     Mental Status: He is alert. Mental status is at baseline.     Motor: No weakness.     Gait: Gait normal.  Psychiatric:        Mood and Affect: Mood normal.        Behavior: Behavior normal.      UC Treatments / Results  Labs (all labs ordered are listed, but only abnormal results are displayed) Labs Reviewed - No data to display  EKG   Radiology No results found.  Procedures Procedures (including critical care time)  Medications Ordered in UC Medications - No data to display  Initial Impression / Assessment and Plan / UC Course  I have reviewed the triage vital signs and the nursing notes.  Pertinent labs & imaging results that were available during my care of the patient were reviewed by me and considered in my medical decision making (see chart for details).   26 year old  male presenting for most complaints.  Reporting dental abscess for the past couple weeks.  Started to improve once he had the tooth  extracted but swelling recently worsened over the past week.  He does have visible left facial swelling and swollen and firm area of the left buccal mucosa on the lower aspect.  Extracted first molar.  Sent Augmentin as he has continued infection and very foul odor in mouth.  Advise following up with dentist.  Patient also reporting a rash of the left side of his neck which appears to be consistent with tinea versicolor.  We will treat with ketoconazole cream.  Additionally reporting cough and left chest tenderness for the past day.  He did have a negative COVID test.  Encouraged him to take another COVID test tomorrow.  Reviewed current CDC guidelines, isolation protocol and ED precautions if COVID-positive.  Advised to continue cough medicine as symptoms likely due to viral illness.  Advise follow-up for worsening of symptoms.   Final Clinical Impressions(s) / UC Diagnoses   Final diagnoses:  Dental abscess  Acute cough  Viral illness  Tinea versicolor     Discharge Instructions      -I have sent antibiotics to the pharmacy possibility of developing a dental abscess.  Follow-up with your dentist. - You have fungal skin infections I have sent a topical cream for that. - You also have a cold.  You will likely get better in 7 to 10 days.  Continue OTC meds.  URI/COLD SYMPTOMS: Your exam today is consistent with a viral illness. Antibiotics are not indicated at this time. Use medications as directed, including cough syrup, nasal saline, and decongestants. Your symptoms should improve over the next few days and resolve within 7-10 days. Increase rest and fluids. F/u if symptoms worsen or predominate such as sore throat, ear pain, productive cough, shortness of breath, or if you develop high fevers or worsening fatigue over the next several days.       ED  Prescriptions     Medication Sig Dispense Auth. Provider   ketoconazole (NIZORAL) 2 % cream Apply 1 Application topically daily for 14 days. 15 g Laurene Footman B, PA-C   amoxicillin-clavulanate (AUGMENTIN) 875-125 MG tablet Take 1 tablet by mouth every 12 (twelve) hours for 10 days. 20 tablet Gretta Cool      PDMP not reviewed this encounter.   Danton Clap, PA-C 09/05/22 1801

## 2022-09-05 NOTE — ED Triage Notes (Signed)
Pt c/o facial swelling x2weeks.  Pt was seen by his dentist for a tooth infection and was given amoxicillin and had his tooth pulled. Pt states that the swelling never went away fully and facial swelling had increased today with some gum inflammation along the lower left jaw line.   Pt states that he is having cough and chest tenderness when touched x1day but denies pain when breathing. Pt denies any SOB or pain when coughing.    Pt has spots along his chest and left side of neck and does not know what is causing them x22months.
